# Patient Record
Sex: Male | Born: 1945 | Race: White | Hispanic: No | Marital: Single | Smoking: Never smoker
Health system: Southern US, Community
[De-identification: ages and names within clinical notes are randomized; demographics above are authoritative.]

## PROBLEM LIST (undated history)

## (undated) DIAGNOSIS — F0390 Unspecified dementia without behavioral disturbance: Secondary | ICD-10-CM

## (undated) DIAGNOSIS — I10 Essential (primary) hypertension: Secondary | ICD-10-CM

## (undated) DIAGNOSIS — E119 Type 2 diabetes mellitus without complications: Secondary | ICD-10-CM

---

## 2014-10-31 ENCOUNTER — Other Ambulatory Visit: Payer: Self-pay | Admitting: Physician Assistant

## 2016-07-04 ENCOUNTER — Emergency Department (HOSPITAL_COMMUNITY): Payer: Medicare Other

## 2016-07-04 ENCOUNTER — Emergency Department (HOSPITAL_COMMUNITY)
Admission: EM | Admit: 2016-07-04 | Discharge: 2016-07-04 | Disposition: A | Discharge status: Home / Self Care | Payer: Medicare Other | Attending: Emergency Medicine | Admitting: Emergency Medicine

## 2016-07-04 ENCOUNTER — Encounter (HOSPITAL_COMMUNITY): Payer: Self-pay | Admitting: *Deleted

## 2016-07-04 DIAGNOSIS — Z7982 Long term (current) use of aspirin: Secondary | ICD-10-CM | POA: Insufficient documentation

## 2016-07-04 DIAGNOSIS — I10 Essential (primary) hypertension: Secondary | ICD-10-CM | POA: Diagnosis not present

## 2016-07-04 DIAGNOSIS — E119 Type 2 diabetes mellitus without complications: Secondary | ICD-10-CM | POA: Diagnosis not present

## 2016-07-04 DIAGNOSIS — Z7984 Long term (current) use of oral hypoglycemic drugs: Secondary | ICD-10-CM | POA: Diagnosis not present

## 2016-07-04 DIAGNOSIS — R1013 Epigastric pain: Secondary | ICD-10-CM | POA: Insufficient documentation

## 2016-07-04 DIAGNOSIS — N400 Enlarged prostate without lower urinary tract symptoms: Secondary | ICD-10-CM | POA: Insufficient documentation

## 2016-07-04 DIAGNOSIS — Z79899 Other long term (current) drug therapy: Secondary | ICD-10-CM | POA: Diagnosis not present

## 2016-07-04 DIAGNOSIS — R0789 Other chest pain: Secondary | ICD-10-CM | POA: Diagnosis present

## 2016-07-04 HISTORY — DX: Type 2 diabetes mellitus without complications: E11.9

## 2016-07-04 HISTORY — DX: Essential (primary) hypertension: I10

## 2016-07-04 LAB — BASIC METABOLIC PANEL
ANION GAP: 14 (ref 5–15)
BUN: 14 mg/dL (ref 6–20)
CALCIUM: 9.7 mg/dL (ref 8.9–10.3)
CO2: 25 mmol/L (ref 22–32)
Chloride: 96 mmol/L — ABNORMAL LOW (ref 101–111)
Creatinine, Ser: 1.07 mg/dL (ref 0.61–1.24)
Glucose, Bld: 218 mg/dL — ABNORMAL HIGH (ref 65–99)
POTASSIUM: 3.2 mmol/L — AB (ref 3.5–5.1)
SODIUM: 135 mmol/L (ref 135–145)

## 2016-07-04 LAB — I-STAT TROPONIN, ED: Troponin i, poc: 0.01 ng/mL (ref 0.00–0.08)

## 2016-07-04 LAB — TROPONIN I: TROPONIN I: 0.04 ng/mL — AB (ref <0.03)

## 2016-07-04 LAB — CBC
HEMATOCRIT: 40.2 % (ref 39.0–52.0)
HEMOGLOBIN: 13.8 g/dL (ref 13.0–17.0)
MCH: 27.8 pg (ref 26.0–34.0)
MCHC: 34.3 g/dL (ref 30.0–36.0)
MCV: 81 fL (ref 78.0–100.0)
Platelets: 318 10*3/uL (ref 150–400)
RBC: 4.96 MIL/uL (ref 4.22–5.81)
RDW: 12.7 % (ref 11.5–15.5)
WBC: 14.9 10*3/uL — AB (ref 4.0–10.5)

## 2016-07-04 LAB — LIPASE, BLOOD: Lipase: 21 U/L (ref 11–51)

## 2016-07-04 MED ORDER — IOPAMIDOL (ISOVUE-300) INJECTION 61%
100.0000 mL | Freq: Once | INTRAVENOUS | Status: AC | PRN
  Administered 2016-07-04: 100 mL via INTRAVENOUS

## 2016-07-04 MED ORDER — MORPHINE SULFATE (PF) 4 MG/ML IV SOLN
6.0000 mg | Freq: Once | INTRAVENOUS | Status: AC
  Administered 2016-07-04: 6 mg via INTRAVENOUS
  Filled 2016-07-04: qty 2

## 2016-07-04 NOTE — ED Provider Notes (Signed)
[facility]EMERGENCY DEPT Provider Note   CSN: [id number] Arrival date & time: 07/04/16  1640     History   Chief Complaint Chief Complaint  Patient presents with  . Chest Pain    HPI Justin Foster is a 71 y.o. male with history of DM, HTN, who presents today with chief complaint acute onset, progressively improving epigastric pain which began at 2 AM this morning. He states pain was severe and awoke him, localized to the epigastric region with radiation straight to the back. He describes the pain as a "pressure ", which has progressively improved as the day has gone on. Laying down improved his symptoms, leaning forward worsened his pain. He has not had anything to eat or drink, and has not had any of his medications today. He states pain is currently very mild. He has a history of hiatal hernia, and states his symptoms feel similarly to his typical reflux, but were more severe. Had chili for dinner last night. He denies nausea, vomiting, diaphoresis, chest pain, shortness of breath, lightheadedness, syncope, melena, hematuria, dysuria. He states he has no significant cardiac history. He denies recent travel or surgeries, no hemoptysis, no history of DVT/PE, and no testosterone or hormone placement therapy.  The history is provided by the patient.    Past Medical History:  Diagnosis Date  . Diabetes mellitus without complication (HCC)   . Hypertension     There are no active problems to display for this patient.   History reviewed. No pertinent surgical history.     Home Medications    Prior to Admission medications   Medication Sig Start Date End Date Taking? Authorizing Provider  amLODipine (NORVASC) 10 MG tablet Take 10 mg by mouth at bedtime.  02/09/16  Yes [provider]  aspirin EC 81 MG tablet Take 81 mg by mouth daily. 07/08/15  Yes [provider]  glipiZIDE (GLUCOTROL) 5 MG tablet Take 5 mg by mouth 2 (two) times daily before a meal.  05/10/16  Yes  [provider]  ibuprofen (ADVIL,MOTRIN) 200 MG tablet Take 400 mg by mouth every 6 (six) hours as needed for headache or mild pain.   Yes [provider]  lisinopril-hydrochlorothiazide (PRINZIDE,ZESTORETIC) 20-25 MG tablet Take 1 tablet by mouth at bedtime.  05/10/16  Yes [provider]  metFORMIN (GLUCOPHAGE) 1000 MG tablet Take 500 mg by mouth.  02/09/16  Yes [provider]  simvastatin (ZOCOR) 20 MG tablet Take 5 mg by mouth at bedtime.  05/10/16  Yes [provider]  acetaminophen (TYLENOL) 500 MG tablet Take 1,000 mg by mouth every 6 (six) hours as needed for mild pain.    [provider]  ranitidine (ZANTAC) 150 MG tablet Take 150 mg by mouth as needed for heartburn.    [provider]    Family History No family history on file.  Social History Social History  Substance Use Topics  . Smoking status: Never Smoker  . Smokeless tobacco: Never Used  . Alcohol use No     Allergies   Patient has no known allergies.   Review of Systems Review of Systems  Constitutional: Positive for appetite change. Negative for chills, diaphoresis and fever.  Respiratory: Negative for shortness of breath.   Cardiovascular: Negative for chest pain and leg swelling.  Gastrointestinal: Positive for abdominal pain. Negative for diarrhea, nausea and vomiting.  Genitourinary: Negative for dysuria and hematuria.  Musculoskeletal: Negative for back pain.  Neurological: Negative for syncope and light-headedness.  All other systems reviewed and are negative.    Physical Exam Updated Vital Signs BP 127/74   Pulse 90   Temp 98.8 F (37.1 C) (Oral)   Resp 16   Ht 5\' 9"  (1.753 m)   Wt 74.8 kg (165 lb)   SpO2 94%   BMI 24.37 kg/m   Physical Exam  Constitutional: He appears well-developed and well-nourished.  HENT:  Head: Normocephalic and atraumatic.  Eyes: Conjunctivae are normal.  Neck: Neck supple.  Cardiovascular: Normal  rate and regular rhythm.   No murmur heard. Pulmonary/Chest: Effort normal and breath sounds normal. No respiratory distress. He exhibits no tenderness.  Abdominal: Soft. There is no tenderness.  Mild discomfort on palpation of the epigastric region. Murphy's sign absent, no tenderness to palpation at McBurney's point, Rovsing's absent. CVA tenderness absent  Musculoskeletal: He exhibits no edema.  Neurological: He is alert.  Skin: Skin is warm and dry.  Psychiatric: He has a normal mood and affect. His behavior is normal.  Nursing note and vitals reviewed.    ED Treatments / Results  Labs (all labs ordered are listed, but only abnormal results are displayed) Labs Reviewed  BASIC METABOLIC PANEL - Abnormal; Notable for the following:       Result Value   Potassium 3.2 (*)    Chloride 96 (*)    Glucose, Bld 218 (*)    All other components within normal limits  CBC - Abnormal; Notable for the following:    WBC 14.9 (*)    All other components within normal limits  TROPONIN I - Abnormal; Notable for the following:    Troponin I 0.04 (*)    All other components within normal limits  LIPASE, BLOOD  I-STAT TROPOININ, ED    EKG  EKG Interpretation  Date/Time:  Wednesday July 04 2016 17:49:24 EDT Ventricular Rate:  108 PR Interval:  158 QRS Duration: 100 QT Interval:  366 QTC Calculation: 490 R Axis:   68 Text Interpretation:  Sinus tachycardia Incomplete right bundle branch block Borderline ECG No significant change was found Confirmed by Azalia Bilis ([id number]) on 07/04/2016 9:19:19 PM       Radiology Dg Chest 2 View  Result Date: 07/04/2016 CLINICAL DATA:  Epigastric pain for 2 days, no relief with antacids, history GERD, hypertension, diabetes mellitus EXAM: CHEST  2 VIEW COMPARISON:  None FINDINGS: Normal heart size, mediastinal contours, and pulmonary vascularity. Minimal atherosclerotic calcification at aortic arch. Mild peribronchial thickening. No pulmonary  infiltrate, pleural effusion, or pneumothorax. Mild scattered endplate spur formation thoracic spine. IMPRESSION: Bronchitic changes without infiltrate. Aortic Atherosclerosis (ICD10-I70.0). Electronically Signed   By: Ulyses Southward M.D.   On: 07/04/2016 18:25   Ct Abdomen Pelvis W Contrast  Result Date: 07/04/2016 CLINICAL DATA:  Abdominal pain radiating to the back EXAM: CT ABDOMEN AND PELVIS WITH CONTRAST TECHNIQUE: Multidetector CT imaging of the abdomen and pelvis was performed using the standard protocol following bolus administration of intravenous contrast. CONTRAST:  ISOVUE-300 IOPAMIDOL (ISOVUE-300) INJECTION 61% COMPARISON:  None. FINDINGS: Lower chest: No pulmonary nodules. No visible pleural or pericardial effusion. There are coronary artery calcifications noted. Hepatobiliary: Normal hepatic size and contours without focal liver lesion. No perihepatic ascites. No intra- or extrahepatic biliary dilatation. Multifocal gallbladder calcification Pancreas: Normal pancreatic contours and enhancement. No peripancreatic fluid collection or pancreatic ductal dilatation. Spleen: Normal. Adrenals/Urinary Tract: Normal adrenal glands. No hydronephrosis or solid renal mass. 3.5 cm left renal cysts. Stomach/Bowel: There is no hiatal hernia. The stomach and  duodenum are normal. There is no dilated small bowel or enteric inflammation. There is no colonic abnormality. The appendix is normal. Vascular/Lymphatic: There is atherosclerotic calcification of the non aneurysmal abdominal aorta. No abdominal or pelvic adenopathy. Reproductive: The prostate is markedly enlarged measuring 6.6 cm transverse Musculoskeletal: Multilevel thoracolumbar osteophytosis. No bony spinal canal stenosis. Normal visualized extrathoracic and extraperitoneal soft tissues. Other: No contributory non-categorized findings. IMPRESSION: 1. No acute abnormality of the abdomen or pelvis. 2. Very large prostate, measuring 6.6 cm in transverse  dimension. 3. Multifocal gallbladder calcification, likely partial calcification of the gallbladder wall versus multiple adherent gallstones. No evidence of acute cholecystitis. 4.  Aortic Atherosclerosis (ICD10-I70.0). Electronically Signed   By: Deatra RobinsonKevin  Herman M.D.   On: 07/04/2016 22:31    Procedures Procedures (including critical care time)  Medications Ordered in ED Medications  morphine 4 MG/ML injection 6 mg (6 mg Intravenous Given 07/04/16 2222)  iopamidol (ISOVUE-300) 61 % injection 100 mL (100 mLs Intravenous Contrast Given 07/04/16 2200)     Initial Impression / Assessment and Plan / ED Course  I have reviewed the triage vital signs and the nursing notes.  Pertinent labs & imaging results that were available during my care of the patient were reviewed by me and considered in my medical decision making (see chart for details).     Patient with history of hiatal hernia presents today with progressively improving epigastric pain since 2 AM this morning. Afebrile, hypertensive here initially, however patient did not have any of his daily medications today.  EKG shows sinus tachycardia with no significant change from last, troponins negative. Low suspicion of ACS/MI given history and physical examination. Chest x-ray shows mild bronchitic changes without infiltrate. Low suspicion of pneumonia, PE, or pleural effusion. CBC shows nonspecific leukocytosis of 14.9. CT abdomen and pelvis shows no acute intra-abdominal abnormality. However does show a large prostate measuring 6.6 cm as well as multifocal gallbladder calcifications without evidence of acute cholecystitis. In the absence of an elevated lipase or evidence on imaging, low suspicion of acute pancreatitis. No evidence of AAA on imaging. Pain managed while in the ED. Suspect worsening reflux secondary to hiatal hernia. Recommend follow-up with primary care for reevaluation. Also recommend follow-up with urology for evaluation of enlarged  prostate. Encouraged continued use of patient's PPI, which has been helpful up to now. Discussed indications for return to the ED. Pt verbalized understanding of and agreement with plan and is safe for discharge home at this time. Final Clinical Impressions(s) / ED Diagnoses   Final diagnoses:  Epigastric pain  Enlarged prostate    New Prescriptions Discharge Medication List as of 07/04/2016 11:01 PM       Jeanie SewerFawze, Tywan Siever A, PA-C 07/04/16 2356    Azalia Bilisampos, Kevin, MD 07/05/16 [phone]

## 2016-07-04 NOTE — ED Triage Notes (Signed)
The pt is c/o chest or epigastric pain since 0200am today  He has also had some indigestion for the past several days. Hx of hiatus hernis  No sob no nausea

## 2016-07-04 NOTE — ED Notes (Signed)
Pt s iv placed and lab work drawn

## 2016-07-04 NOTE — Discharge Instructions (Signed)
Continue taking your reflux medications and blood pressure medications daily as prescribed. Follow-up with your primary care for reevaluation of your hiatal hernia. Follow-up with urology for evaluation of your enlarged prostate. Return to the ED if any concerning signs or symptoms develop.

## 2018-05-20 IMAGING — CT CT ABD-PELV W/ CM
2 of 5 series · 15 of 46 positions shown, 17 images · IV contrast (iopamidol)
Comparison: None.

CLINICAL DATA: Abdominal pain radiating to the back

EXAM:
CT ABDOMEN AND PELVIS WITH CONTRAST
TECHNIQUE: Multidetector CT imaging of the abdomen and pelvis was performed
using the standard protocol following bolus administration of
intravenous contrast.
CONTRAST:  100mL 3SXRGF-SDD IOPAMIDOL (3SXRGF-SDD) INJECTION 61%

[Series 3: abd/ pelvis 5.0 i30f 2 · axial · 0.67mm/px · z∈[+894,+1309]mm · 12 of 95 slices shown, 14 images]
[im 6/95  soft-tissue]
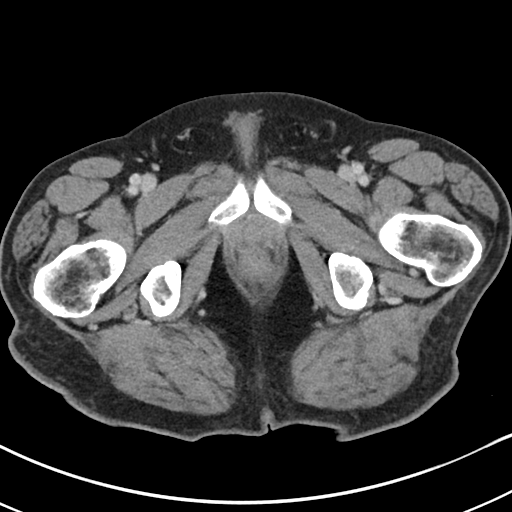
[im 6/95  bone]
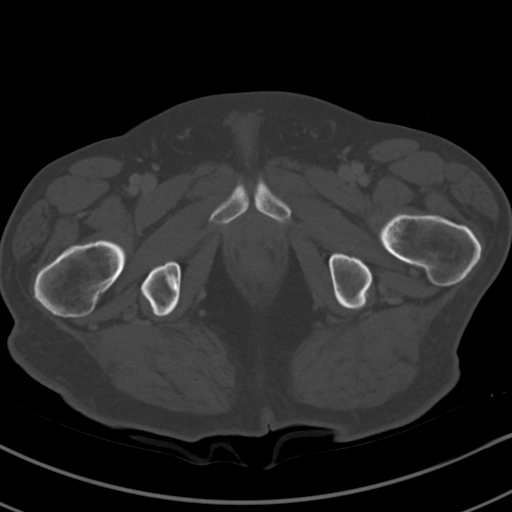
[im 17/95  soft-tissue]
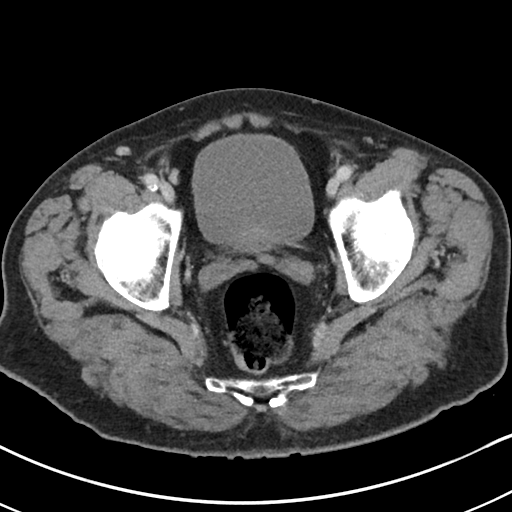
[im 23/95  soft-tissue]
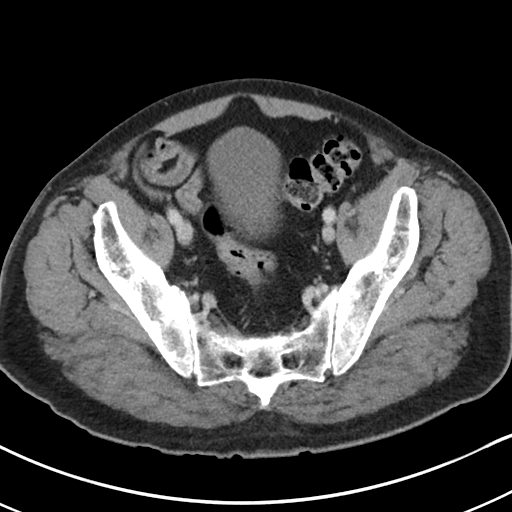
[im 28/95  soft-tissue]
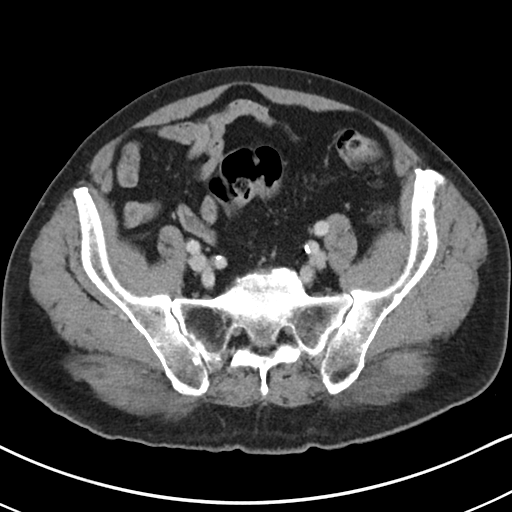
[im 39/95  soft-tissue]
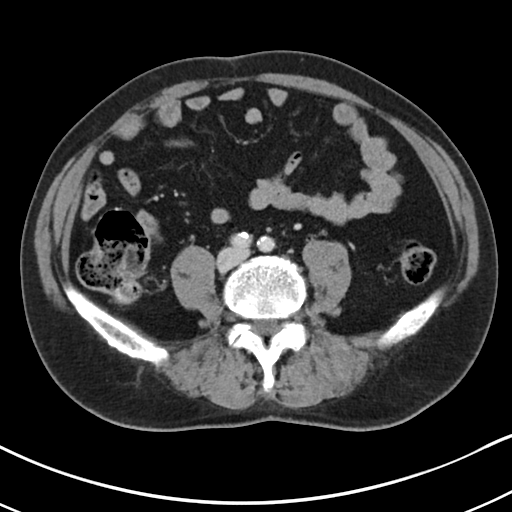
[im 45/95  soft-tissue]
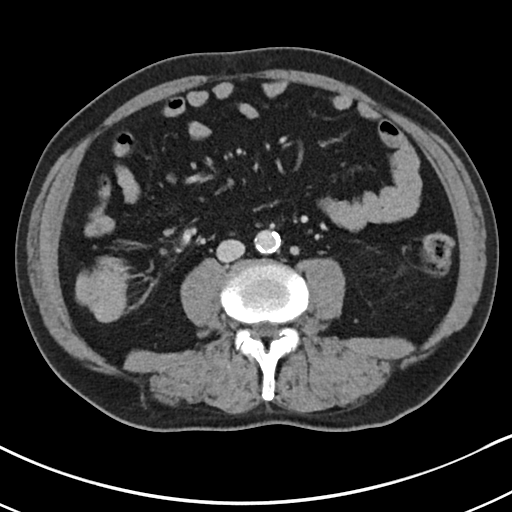
[im 50/95  soft-tissue]
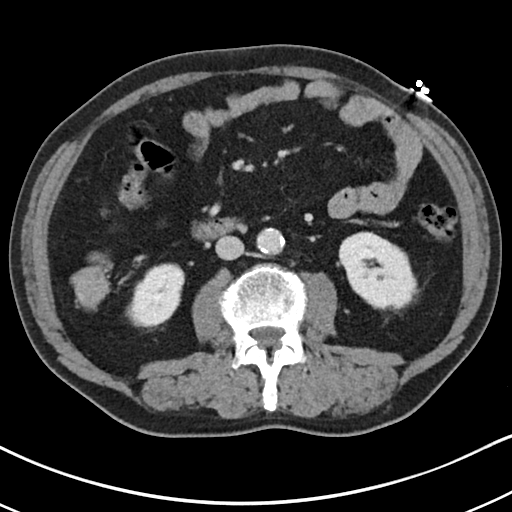
[im 61/95  soft-tissue]
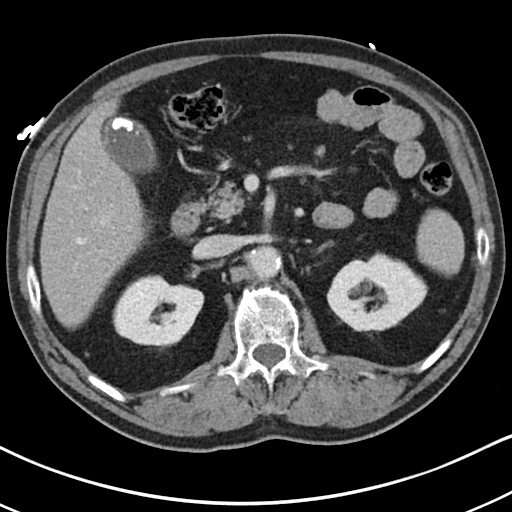
[im 67/95  soft-tissue]
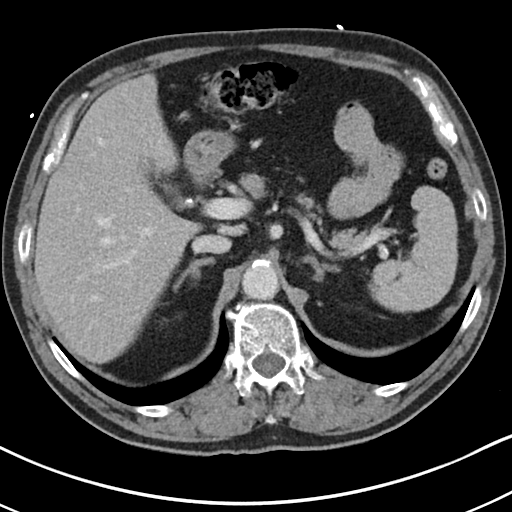
[im 67/95  bone]
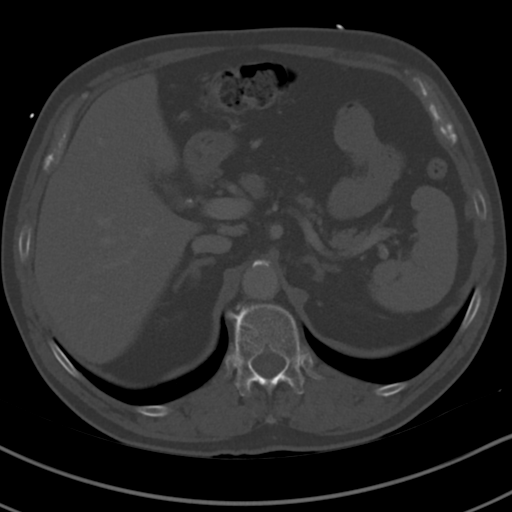
[im 72/95  soft-tissue]
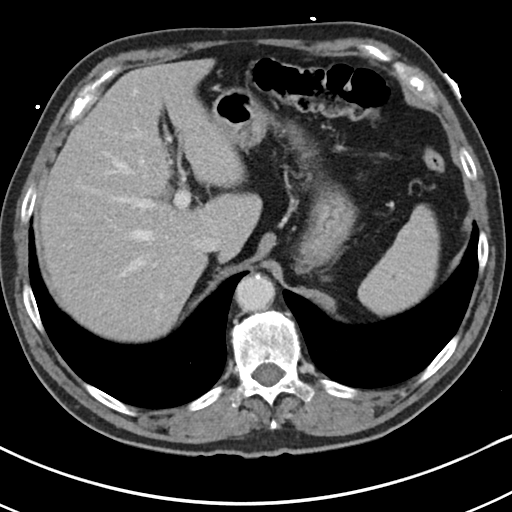
[im 83/95  soft-tissue]
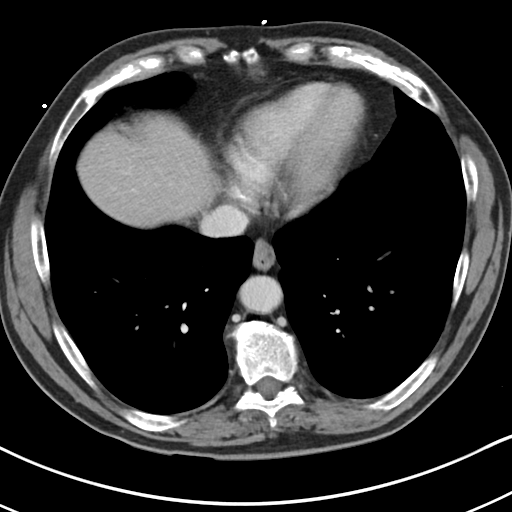
[im 89/95  soft-tissue]
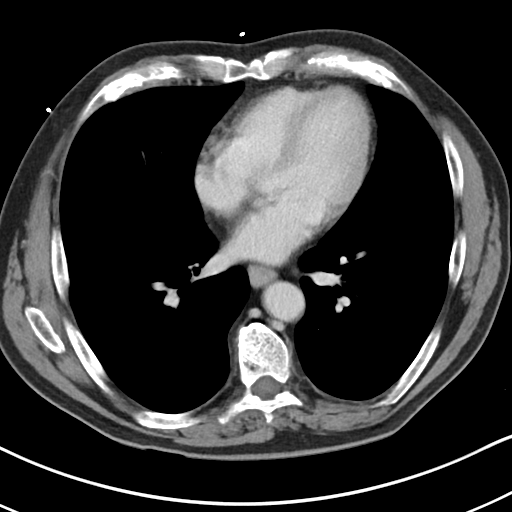

[Series 6: coronal soft tissue · coronal · 0.66mm/px · 3 of 101 slices shown]
[im 34/101  soft-tissue]
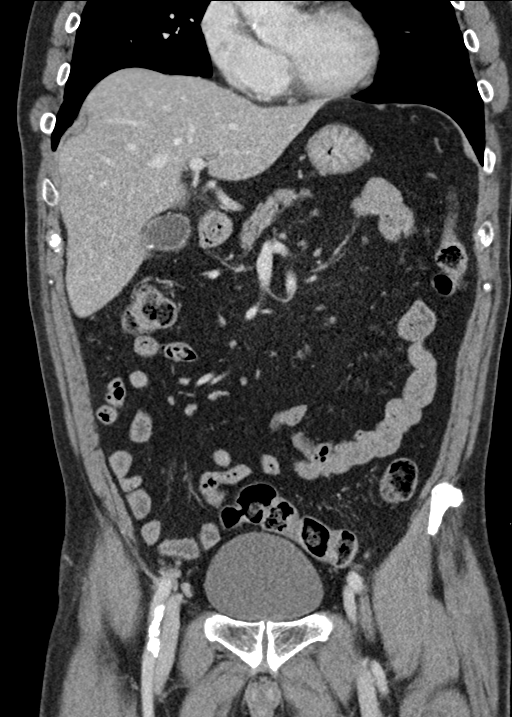
[im 45/101  soft-tissue]
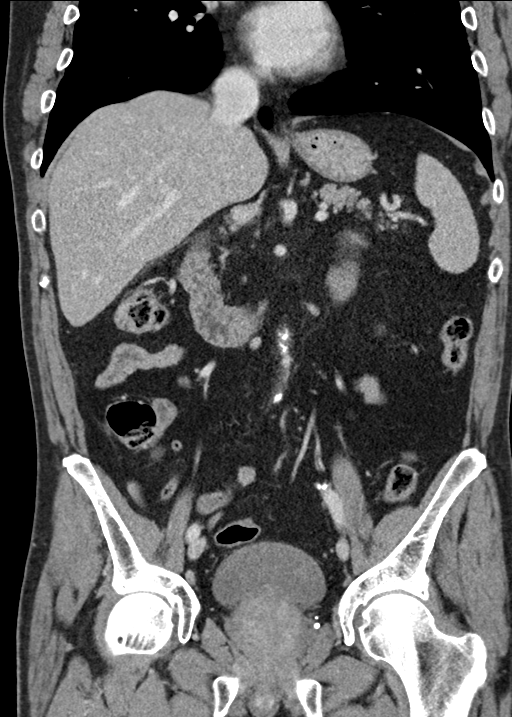
[im 56/101  soft-tissue]
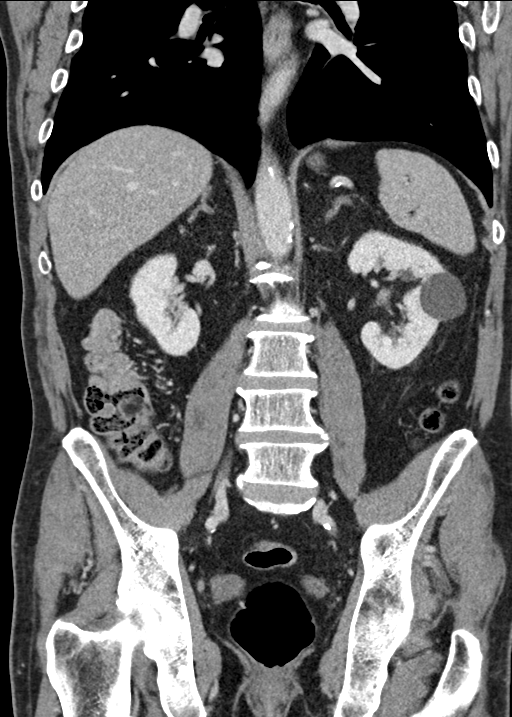

[15 of 46 positions shown; findings below may reference images not displayed]

FINDINGS: Lower chest: No pulmonary nodules. No visible pleural or pericardial
effusion. There are coronary artery calcifications noted.

Hepatobiliary: Normal hepatic size and contours without focal liver
lesion. No perihepatic ascites. No intra- or extrahepatic biliary
dilatation. Multifocal gallbladder calcification

Pancreas: Normal pancreatic contours and enhancement. No
peripancreatic fluid collection or pancreatic ductal dilatation.

Spleen: Normal.

Adrenals/Urinary Tract: Normal adrenal glands. No hydronephrosis or
solid renal mass. 3.5 cm left renal cysts.

Stomach/Bowel: There is no hiatal hernia. The stomach and duodenum
are normal. There is no dilated small bowel or enteric inflammation.
There is no colonic abnormality. The appendix is normal.

Vascular/Lymphatic: There is atherosclerotic calcification of the
non aneurysmal abdominal aorta. No abdominal or pelvic adenopathy.

Reproductive: The prostate is markedly enlarged measuring 6.6 cm
transverse

Musculoskeletal: Multilevel thoracolumbar osteophytosis. No bony
spinal canal stenosis. Normal visualized extrathoracic and
extraperitoneal soft tissues.

Other: No contributory non-categorized findings.
IMPRESSION: 1. No acute abnormality of the abdomen or pelvis.
2. Very large prostate, measuring 6.6 cm in transverse dimension.
3. Multifocal gallbladder calcification, likely partial
calcification of the gallbladder wall versus multiple adherent
gallstones. No evidence of acute cholecystitis.
4.  Aortic Atherosclerosis ([id number]-C26.6).

## 2022-09-18 ENCOUNTER — Other Ambulatory Visit: Payer: Self-pay

## 2022-09-18 ENCOUNTER — Emergency Department (HOSPITAL_BASED_OUTPATIENT_CLINIC_OR_DEPARTMENT_OTHER): Payer: Medicare HMO

## 2022-09-18 ENCOUNTER — Inpatient Hospital Stay (HOSPITAL_BASED_OUTPATIENT_CLINIC_OR_DEPARTMENT_OTHER)
Admission: EM | Admit: 2022-09-18 | Discharge: 2022-09-24 | DRG: 887 | Disposition: A | Discharge status: Home Health | Payer: Medicare HMO | Attending: Internal Medicine | Admitting: Internal Medicine

## 2022-09-18 ENCOUNTER — Encounter (HOSPITAL_BASED_OUTPATIENT_CLINIC_OR_DEPARTMENT_OTHER): Payer: Self-pay

## 2022-09-18 ENCOUNTER — Emergency Department (HOSPITAL_BASED_OUTPATIENT_CLINIC_OR_DEPARTMENT_OTHER): Payer: Medicare HMO | Admitting: Radiology

## 2022-09-18 DIAGNOSIS — E785 Hyperlipidemia, unspecified: Secondary | ICD-10-CM | POA: Diagnosis present

## 2022-09-18 DIAGNOSIS — G928 Other toxic encephalopathy: Secondary | ICD-10-CM | POA: Diagnosis not present

## 2022-09-18 DIAGNOSIS — E876 Hypokalemia: Secondary | ICD-10-CM | POA: Diagnosis not present

## 2022-09-18 DIAGNOSIS — Z7984 Long term (current) use of oral hypoglycemic drugs: Secondary | ICD-10-CM

## 2022-09-18 DIAGNOSIS — R112 Nausea with vomiting, unspecified: Secondary | ICD-10-CM

## 2022-09-18 DIAGNOSIS — Z7982 Long term (current) use of aspirin: Secondary | ICD-10-CM

## 2022-09-18 DIAGNOSIS — E119 Type 2 diabetes mellitus without complications: Secondary | ICD-10-CM | POA: Diagnosis present

## 2022-09-18 DIAGNOSIS — R338 Other retention of urine: Secondary | ICD-10-CM | POA: Diagnosis present

## 2022-09-18 DIAGNOSIS — K219 Gastro-esophageal reflux disease without esophagitis: Secondary | ICD-10-CM | POA: Diagnosis present

## 2022-09-18 DIAGNOSIS — I10 Essential (primary) hypertension: Secondary | ICD-10-CM | POA: Diagnosis present

## 2022-09-18 DIAGNOSIS — R410 Disorientation, unspecified: Secondary | ICD-10-CM

## 2022-09-18 DIAGNOSIS — G4700 Insomnia, unspecified: Principal | ICD-10-CM | POA: Diagnosis present

## 2022-09-18 DIAGNOSIS — Z1152 Encounter for screening for COVID-19: Secondary | ICD-10-CM | POA: Diagnosis not present

## 2022-09-18 DIAGNOSIS — Z79899 Other long term (current) drug therapy: Secondary | ICD-10-CM

## 2022-09-18 DIAGNOSIS — N401 Enlarged prostate with lower urinary tract symptoms: Secondary | ICD-10-CM | POA: Diagnosis present

## 2022-09-18 DIAGNOSIS — N179 Acute kidney failure, unspecified: Secondary | ICD-10-CM | POA: Diagnosis not present

## 2022-09-18 DIAGNOSIS — N320 Bladder-neck obstruction: Secondary | ICD-10-CM | POA: Diagnosis not present

## 2022-09-18 DIAGNOSIS — R509 Fever, unspecified: Secondary | ICD-10-CM

## 2022-09-18 DIAGNOSIS — R4182 Altered mental status, unspecified: Secondary | ICD-10-CM | POA: Diagnosis present

## 2022-09-18 DIAGNOSIS — G459 Transient cerebral ischemic attack, unspecified: Principal | ICD-10-CM

## 2022-09-18 LAB — RAPID URINE DRUG SCREEN, HOSP PERFORMED
Amphetamines: NOT DETECTED
Barbiturates: NOT DETECTED
Benzodiazepines: NOT DETECTED
Cocaine: NOT DETECTED
Opiates: NOT DETECTED
Tetrahydrocannabinol: NOT DETECTED

## 2022-09-18 LAB — RESP PANEL BY RT-PCR (RSV, FLU A&B, COVID)  RVPGX2
Influenza A by PCR: NEGATIVE
Influenza B by PCR: NEGATIVE
Resp Syncytial Virus by PCR: NEGATIVE
SARS Coronavirus 2 by RT PCR: NEGATIVE

## 2022-09-18 LAB — CBC
HCT: 38.8 % — ABNORMAL LOW (ref 39.0–52.0)
Hemoglobin: 13 g/dL (ref 13.0–17.0)
MCH: 28 pg (ref 26.0–34.0)
MCHC: 33.5 g/dL (ref 30.0–36.0)
MCV: 83.6 fL (ref 80.0–100.0)
Platelets: 262 10*3/uL (ref 150–400)
RBC: 4.64 MIL/uL (ref 4.22–5.81)
RDW: 12.1 % (ref 11.5–15.5)
WBC: 7.1 10*3/uL (ref 4.0–10.5)
nRBC: 0 % (ref 0.0–0.2)

## 2022-09-18 LAB — COMPREHENSIVE METABOLIC PANEL
ALT: 13 U/L (ref 0–44)
AST: 12 U/L — ABNORMAL LOW (ref 15–41)
Albumin: 4 g/dL (ref 3.5–5.0)
Alkaline Phosphatase: 52 U/L (ref 38–126)
Anion gap: 7 (ref 5–15)
BUN: 14 mg/dL (ref 8–23)
CO2: 29 mmol/L (ref 22–32)
Calcium: 9 mg/dL (ref 8.9–10.3)
Chloride: 99 mmol/L (ref 98–111)
Creatinine, Ser: 0.83 mg/dL (ref 0.61–1.24)
GFR, Estimated: >60 mL/min (ref >60)
Glucose, Bld: 211 mg/dL — ABNORMAL HIGH (ref 70–99)
Potassium: 3.3 mmol/L — ABNORMAL LOW (ref 3.5–5.1)
Sodium: 135 mmol/L (ref 135–145)
Total Bilirubin: 0.6 mg/dL (ref 0.3–1.2)
Total Protein: 7.2 g/dL (ref 6.5–8.1)

## 2022-09-18 LAB — URINALYSIS, ROUTINE W REFLEX MICROSCOPIC
Bacteria, UA: NONE SEEN
Bilirubin Urine: NEGATIVE
Glucose, UA: >1000 mg/dL — AB
Ketones, ur: NEGATIVE mg/dL
Leukocytes,Ua: NEGATIVE
Nitrite: NEGATIVE
Specific Gravity, Urine: 1.012 (ref 1.005–1.030)
pH: 7 (ref 5.0–8.0)

## 2022-09-18 LAB — ETHANOL: Alcohol, Ethyl (B): <10 mg/dL (ref <10)

## 2022-09-18 LAB — LIPASE, BLOOD: Lipase: <10 U/L — ABNORMAL LOW (ref 11–51)

## 2022-09-18 LAB — CBG MONITORING, ED: Glucose-Capillary: 184 mg/dL — ABNORMAL HIGH (ref 70–99)

## 2022-09-18 MED ORDER — ONDANSETRON HCL 4 MG/2ML IJ SOLN
4.0000 mg | Freq: Once | INTRAMUSCULAR | Status: AC
  Administered 2022-09-18: 4 mg via INTRAVENOUS
  Filled 2022-09-18: qty 2

## 2022-09-18 MED ORDER — LORAZEPAM 0.5 MG PO TABS
0.5000 mg | ORAL_TABLET | ORAL | Status: AC | PRN
  Administered 2022-09-19: 0.5 mg via ORAL
  Filled 2022-09-18: qty 1

## 2022-09-18 MED ORDER — LIDOCAINE 5 % EX PTCH
1.0000 | MEDICATED_PATCH | Freq: Once | CUTANEOUS | Status: AC
  Administered 2022-09-18: 1 via TRANSDERMAL
  Filled 2022-09-18: qty 1

## 2022-09-18 MED ORDER — ACETAMINOPHEN 500 MG PO TABS
1000.0000 mg | ORAL_TABLET | Freq: Once | ORAL | Status: AC
  Administered 2022-09-18: 1000 mg via ORAL
  Filled 2022-09-18: qty 2

## 2022-09-18 MED ORDER — ONDANSETRON 4 MG PO TBDP: 4.0000 mg | ORAL_TABLET | Freq: Once | ORAL | Status: DC

## 2022-09-18 MED ORDER — IOHEXOL 350 MG/ML SOLN
100.0000 mL | Freq: Once | INTRAVENOUS | Status: AC | PRN
  Administered 2022-09-18: 80 mL via INTRAVENOUS

## 2022-09-18 NOTE — ED Provider Notes (Signed)
[place] EMERGENCY DEPARTMENT AT [facility] Provider Note   CSN: [id number] Arrival date & time: 09/18/22  8657     History  Chief Complaint  Patient presents with   Altered Mental Status    Justin Foster is a 77 y.o. male.   Altered Mental Status    77 year old male with medical history significant for hypertension, diabetes mellitus who presents to the emergency department with his daughter with concern for strokelike symptoms.  The patient baseline mental status is alert and oriented x 2.  The daughter states that he has no known history of a diagnosis of dementia but generally is confused to year at baseline.  She states that this morning at around 7:00 when he woke up he had a transient episode of aphasia where he had difficulty formulating words.  Symptoms lasted for a total of 10 minutes.  His last normal was last night when he went to bed.  He has been having some epigastric discomfort as well, no vomiting.  Home Medications Prior to Admission medications   Medication Sig Start Date End Date Taking? Authorizing Provider  acetaminophen (TYLENOL) 500 MG tablet Take 1,000 mg by mouth every 6 (six) hours as needed for mild pain.    [provider]  amLODipine (NORVASC) 10 MG tablet Take 10 mg by mouth at bedtime.  02/09/16   [provider]  aspirin EC 81 MG tablet Take 81 mg by mouth daily. 07/08/15   [provider]  glipiZIDE (GLUCOTROL) 5 MG tablet Take 5 mg by mouth 2 (two) times daily before a meal.  05/10/16   [provider]  ibuprofen (ADVIL,MOTRIN) 200 MG tablet Take 400 mg by mouth every 6 (six) hours as needed for headache or mild pain.    [provider]  lisinopril-hydrochlorothiazide (PRINZIDE,ZESTORETIC) 20-25 MG tablet Take 1 tablet by mouth at bedtime.  05/10/16   [provider]  metFORMIN (GLUCOPHAGE) 1000 MG tablet Take 500 mg by mouth.  02/09/16   [provider]  ranitidine (ZANTAC) 150  MG tablet Take 150 mg by mouth as needed for heartburn.    [provider]  simvastatin (ZOCOR) 20 MG tablet Take 5 mg by mouth at bedtime.  05/10/16   [provider]      Allergies    Patient has no known allergies.    Review of Systems   Review of Systems  All other systems reviewed and are negative.   Physical Exam Updated Vital Signs BP (!) 165/91   Pulse 83   Temp 98.3 F (36.8 C) (Oral)   Resp 15   Ht 5\' 9"  (1.753 m)   Wt 64.4 kg   SpO2 99%   BMI 20.97 kg/m  Physical Exam Vitals and nursing note reviewed.  Constitutional:      General: He is not in acute distress. HENT:     Head: Normocephalic and atraumatic.  Eyes:     Conjunctiva/sclera: Conjunctivae normal.     Pupils: Pupils are equal, round, and reactive to light.  Cardiovascular:     Rate and Rhythm: Normal rate and regular rhythm.  Pulmonary:     Effort: Pulmonary effort is normal. No respiratory distress.  Abdominal:     General: There is no distension.     Tenderness: There is no abdominal tenderness. There is no guarding.  Musculoskeletal:        General: No deformity or signs of injury.     Cervical back: Neck supple.  Skin:    Findings: No lesion or rash.  Neurological:     General: No focal deficit present.     Mental Status: He is alert. Mental status is at baseline.     ED Results / Procedures / Treatments   Labs (all labs ordered are listed, but only abnormal results are displayed) Labs Reviewed  URINALYSIS, ROUTINE W REFLEX MICROSCOPIC - Abnormal; Notable for the following components:      Result Value   Glucose, UA >1,000 (*)    Hgb urine dipstick SMALL (*)    Protein, ur TRACE (*)    All other components within normal limits  CBC - Abnormal; Notable for the following components:   HCT 38.8 (*)    All other components within normal limits  COMPREHENSIVE METABOLIC PANEL - Abnormal; Notable for the following components:   Potassium 3.3 (*)    Glucose, Bld 211  (*)    AST 12 (*)    All other components within normal limits  CBG MONITORING, ED - Abnormal; Notable for the following components:   Glucose-Capillary 184 (*)    All other components within normal limits  RESP PANEL BY RT-PCR (RSV, FLU A&B, COVID)  RVPGX2  RAPID URINE DRUG SCREEN, HOSP PERFORMED  ETHANOL  LIPASE, BLOOD    EKG EKG Interpretation Date/Time:  Tuesday September 18 2022 09:25:37 EDT Ventricular Rate:  80 PR Interval:  182 QRS Duration:  101 QT Interval:  371 QTC Calculation: 428 R Axis:   -13  Text Interpretation: Sinus rhythm Inferior infarct, old Confirmed by Ernie Avena ([id number]) on 09/18/2022 9:34:00 AM  Radiology CT HEAD WO CONTRAST  Result Date: 09/18/2022 CLINICAL DATA:  Worsening confusion.  Altered mental status.  TIA. EXAM: CT HEAD WITHOUT CONTRAST TECHNIQUE: Contiguous axial images were obtained from the base of the skull through the vertex without intravenous contrast. RADIATION DOSE REDUCTION: This exam was performed according to the departmental dose-optimization program which includes automated exposure control, adjustment of the mA and/or kV according to patient size and/or use of iterative reconstruction technique. COMPARISON:  None Available. FINDINGS: Brain: No evidence of intracranial hemorrhage, acute infarction, hydrocephalus, extra-axial collection, or mass lesion/mass effect. Vascular:  No hyperdense vessel or other acute findings. Skull: No evidence of fracture or other significant bone abnormality. Sinuses/Orbits:  No acute findings. Other: None. IMPRESSION: Negative noncontrast head CT. Electronically Signed   By: Danae Orleans M.D.   On: 09/18/2022 13:00   DG Chest 2 View  Result Date: 09/18/2022 CLINICAL DATA:  Altered mental status EXAM: CHEST - 2 VIEW COMPARISON:  X-ray 07/04/2016 FINDINGS: No consolidation, pneumothorax or effusion. No edema. Normal cardiopericardial silhouette. Overlapping cardiac leads. Degenerative changes are seen along the  spine. IMPRESSION: No acute cardiopulmonary disease. Electronically Signed   By: Karen Kays M.D.   On: 09/18/2022 12:35    Procedures Procedures    Medications Ordered in ED Medications  LORazepam (ATIVAN) tablet 0.5 mg (has no administration in time range)  ondansetron (ZOFRAN) injection 4 mg (4 mg Intravenous Given 09/18/22 1608)    ED Course/ Medical Decision Making/ A&P                                 Medical Decision Making Amount and/or Complexity of Data Reviewed Labs: ordered. Radiology: ordered.  Risk Prescription drug management. Decision regarding hospitalization.    77 year old male with medical history significant for hypertension, diabetes mellitus who presents to the  emergency department with his daughter with concern for strokelike symptoms.  The patient baseline mental status is alert and oriented x 2.  The daughter states that he has no known history of a diagnosis of dementia but generally is confused to year at baseline.  She states that this morning at around 7:00 when he woke up he had a transient episode of aphasia where he had difficulty formulating words.  Symptoms lasted for a total of 10 minutes.  His last normal was last night when he went to bed.  He has been having some epigastric discomfort as well, no vomiting.  On arrival, the patient was afebrile, not tachycardic or tachypneic, RR 19 on my evaluation, BP 183/92, saturating 100% on room air.  Physical exam significant for normal neurologic exam. The patient is since returned to his baseline after an episode of transient aphasia earlier today that lasted around 10 minutes.  Concern for TIA.  Additionally the patient has been having some nausea and epigastric discomfort and vomiting.  Laboratory evaluation revealed UDS negative, urinalysis without evidence of UTI, CBC without a leukocytosis or anemia, CBG 184, ethanol level negative, CMP with only mild hypokalemia to 3.3, otherwise generally unremarkable  with mild hyperglycemia to 211 without an anion gap acidosis.  COVID-19 influenza PCR testing was collected and pending, lipase was collected and pending.  CT imaging of head revealed no acute intracranial abnormality and a chest x-ray was without abnormal findings.  I discussed the care of the patient with on-call neurology, Dr. Otelia Limes who recommended inpatient workup for TIA.  Further workup initiated with CT of the abdomen pelvis as the patient denied epigastric pain but was having ongoing vomiting and stated on repeat assessment that he was not passing gas.  CT abdomen pelvi was ordered to further evaluate to rule out bowel obstruction.  Plan at time of signout to follow-up CT imaging, admit the patient for TIA workup.  Signout given to Dr. Theresia Lo at 1600.   Final Clinical Impression(s) / ED Diagnoses Final diagnoses:  TIA (transient ischemic attack)  Nausea and vomiting, unspecified vomiting type    Rx / DC Orders ED Discharge Orders     None         Ernie Avena, MD 09/18/22 1625

## 2022-09-18 NOTE — ED Notes (Signed)
Report called to Percell Miller, RN, awaiting [facility] for transportation, patient and family aware.

## 2022-09-18 NOTE — ED Provider Notes (Signed)
Patient was signed out to me by Dr. Karene Fry at 1530 pending CT.  In short this is a 77 year old male with a past medical history of hypertension and diabetes that presented to the emergency department after an episode of aphasia lasting for about 10 minutes that resolved.  Workup here showed no acute neurologic deficits and initial head CT was negative.  Previous team and spoke with neurology who recommended admission for TIA.  Prior to admission patient started to complain of abdominal pain with nausea and vomited x 1.  Reports had not been passing gas.  Patient was signed out to me pending CT and lipase prior to admission. Clinical Course as of 09/18/22 1926  Tue Sep 18, 2022  1851 No evidence of bowel obstruction. UA negative for UTI. Will call back the hospitalist for admission. [VK]    Clinical Course User Index [VK] Rexford Maus, DO      Rexford Maus, [place] 09/18/22 [phone]

## 2022-09-18 NOTE — ED Triage Notes (Signed)
States been having increasing confusion over last several months getting worse.  Woke up this am very confused and wandering.  States also having epigastric pain.

## 2022-09-19 ENCOUNTER — Observation Stay (HOSPITAL_COMMUNITY): Payer: Medicare HMO

## 2022-09-19 DIAGNOSIS — R338 Other retention of urine: Secondary | ICD-10-CM

## 2022-09-19 DIAGNOSIS — R569 Unspecified convulsions: Secondary | ICD-10-CM

## 2022-09-19 DIAGNOSIS — I10 Essential (primary) hypertension: Secondary | ICD-10-CM

## 2022-09-19 DIAGNOSIS — R112 Nausea with vomiting, unspecified: Secondary | ICD-10-CM

## 2022-09-19 DIAGNOSIS — R509 Fever, unspecified: Secondary | ICD-10-CM

## 2022-09-19 DIAGNOSIS — E119 Type 2 diabetes mellitus without complications: Secondary | ICD-10-CM

## 2022-09-19 DIAGNOSIS — G459 Transient cerebral ischemic attack, unspecified: Secondary | ICD-10-CM

## 2022-09-19 DIAGNOSIS — E876 Hypokalemia: Secondary | ICD-10-CM

## 2022-09-19 DIAGNOSIS — R410 Disorientation, unspecified: Secondary | ICD-10-CM | POA: Diagnosis not present

## 2022-09-19 LAB — GLUCOSE, CAPILLARY
Glucose-Capillary: 227 mg/dL — ABNORMAL HIGH (ref 70–99)
Glucose-Capillary: 196 mg/dL — ABNORMAL HIGH (ref 70–99)
Glucose-Capillary: 211 mg/dL — ABNORMAL HIGH (ref 70–99)

## 2022-09-19 LAB — BASIC METABOLIC PANEL
Anion gap: 9 (ref 5–15)
BUN: 24 mg/dL — ABNORMAL HIGH (ref 8–23)
CO2: 25 mmol/L (ref 22–32)
Calcium: 8.8 mg/dL — ABNORMAL LOW (ref 8.9–10.3)
Chloride: 102 mmol/L (ref 98–111)
Creatinine, Ser: 1.59 mg/dL — ABNORMAL HIGH (ref 0.61–1.24)
GFR, Estimated: 45 mL/min — ABNORMAL LOW (ref >60)
Glucose, Bld: 212 mg/dL — ABNORMAL HIGH (ref 70–99)
Potassium: 3.3 mmol/L — ABNORMAL LOW (ref 3.5–5.1)
Sodium: 136 mmol/L (ref 135–145)

## 2022-09-19 LAB — MAGNESIUM: Magnesium: 1.8 mg/dL (ref 1.7–2.4)

## 2022-09-19 LAB — AMMONIA: Ammonia: 28 umol/L (ref 9–35)

## 2022-09-19 LAB — VITAMIN B12: Vitamin B-12: 258 pg/mL (ref 180–914)

## 2022-09-19 LAB — HEMOGLOBIN A1C
Hgb A1c MFr Bld: 7 % — ABNORMAL HIGH (ref 4.8–5.6)
Mean Plasma Glucose: 154.2 mg/dL

## 2022-09-19 LAB — RPR: RPR Ser Ql: NONREACTIVE

## 2022-09-19 LAB — TSH: TSH: 2.482 u[IU]/mL (ref 0.350–4.500)

## 2022-09-19 MED ORDER — INSULIN ASPART 100 UNIT/ML IJ SOLN
0.0000 [IU] | Freq: Three times a day (TID) | INTRAMUSCULAR | Status: DC
  Administered 2022-09-19: 3 [IU] via SUBCUTANEOUS
  Administered 2022-09-19: 2 [IU] via SUBCUTANEOUS
  Administered 2022-09-20 (×2): 3 [IU] via SUBCUTANEOUS
  Administered 2022-09-20: 2 [IU] via SUBCUTANEOUS
  Administered 2022-09-21: 3 [IU] via SUBCUTANEOUS
  Administered 2022-09-21 – 2022-09-22 (×3): 2 [IU] via SUBCUTANEOUS
  Administered 2022-09-22 – 2022-09-23 (×3): 3 [IU] via SUBCUTANEOUS
  Administered 2022-09-23: 2 [IU] via SUBCUTANEOUS
  Administered 2022-09-23: 3 [IU] via SUBCUTANEOUS
  Administered 2022-09-24: 2 [IU] via SUBCUTANEOUS
  Administered 2022-09-24: 3 [IU] via SUBCUTANEOUS

## 2022-09-19 MED ORDER — ACETAMINOPHEN 650 MG RE SUPP: 650.0000 mg | Freq: Four times a day (QID) | RECTAL | Status: DC | PRN

## 2022-09-19 MED ORDER — SIMVASTATIN 5 MG PO TABS
5.0000 mg | ORAL_TABLET | Freq: Every day | ORAL | Status: DC
  Administered 2022-09-19 – 2022-09-23 (×5): 5 mg via ORAL
  Filled 2022-09-19 (×6): qty 1

## 2022-09-19 MED ORDER — ASPIRIN 81 MG PO TBEC: 81.0000 mg | DELAYED_RELEASE_TABLET | Freq: Every day | ORAL | Status: DC

## 2022-09-19 MED ORDER — POTASSIUM CHLORIDE CRYS ER 20 MEQ PO TBCR
40.0000 meq | EXTENDED_RELEASE_TABLET | Freq: Once | ORAL | Status: AC
  Administered 2022-09-19: 40 meq via ORAL
  Filled 2022-09-19: qty 2

## 2022-09-19 MED ORDER — PANTOPRAZOLE SODIUM 40 MG PO TBEC
40.0000 mg | DELAYED_RELEASE_TABLET | Freq: Every day | ORAL | Status: DC
  Administered 2022-09-19 – 2022-09-24 (×6): 40 mg via ORAL
  Filled 2022-09-19 (×7): qty 1

## 2022-09-19 MED ORDER — IOHEXOL 350 MG/ML SOLN
75.0000 mL | Freq: Once | INTRAVENOUS | Status: AC | PRN
  Administered 2022-09-19: 75 mL via INTRAVENOUS

## 2022-09-19 MED ORDER — AMLODIPINE BESYLATE 10 MG PO TABS
10.0000 mg | ORAL_TABLET | Freq: Every day | ORAL | Status: DC
  Administered 2022-09-19 – 2022-09-20 (×2): 10 mg via ORAL
  Filled 2022-09-19 (×2): qty 1

## 2022-09-19 MED ORDER — HYDROXYZINE HCL 10 MG PO TABS
10.0000 mg | ORAL_TABLET | Freq: Three times a day (TID) | ORAL | Status: DC | PRN
  Administered 2022-09-19 – 2022-09-24 (×5): 10 mg via ORAL
  Filled 2022-09-19 (×5): qty 1

## 2022-09-19 MED ORDER — HYDRALAZINE HCL 20 MG/ML IJ SOLN: 10.0000 mg | INTRAMUSCULAR | Status: DC | PRN

## 2022-09-19 MED ORDER — ACETAMINOPHEN 325 MG PO TABS
650.0000 mg | ORAL_TABLET | Freq: Four times a day (QID) | ORAL | Status: DC | PRN
  Administered 2022-09-20 – 2022-09-23 (×4): 650 mg via ORAL
  Filled 2022-09-19 (×5): qty 2

## 2022-09-19 MED ORDER — ENOXAPARIN SODIUM 40 MG/0.4ML IJ SOSY: 40.0000 mg | PREFILLED_SYRINGE | INTRAMUSCULAR | Status: DC

## 2022-09-19 MED ORDER — INSULIN ASPART 100 UNIT/ML IJ SOLN
0.0000 [IU] | Freq: Every day | INTRAMUSCULAR | Status: DC
  Administered 2022-09-19 – 2022-09-23 (×3): 2 [IU] via SUBCUTANEOUS

## 2022-09-19 NOTE — Progress Notes (Signed)
Pt admitted to room, oriented to unit and surroundings. Daughter at bedside. Denies pain. Alert and oriented. Pt placement notified of pt's arrival for notification to admitting provider.

## 2022-09-19 NOTE — Progress Notes (Signed)
   09/19/22 0620  Vitals  ECG Heart Rate 89  MEWS COLOR  MEWS Score Color Green  MEWS Score  MEWS Temp 0  MEWS Systolic 0  MEWS Pulse 0  MEWS RR 0  MEWS LOC 0  MEWS Score 0  Provider Notification  Provider Name/Title Dr. Loney Loh  Date Provider Notified 09/19/22  Time Provider Notified [phone]  Method of Notification  (secure chat)  Notification Reason Other (Comment) (in and out cath x2 this shift (pink urine) hematuria this am. Pt unable to void after multiple attempts this am)  Provider response No new orders  Date of Provider Response 09/19/22  Time of Provider Response [phone]

## 2022-09-19 NOTE — Consult Note (Signed)
Neurology Consultation Reason for Consult: Transient speech difficulty Referring Physician: Hilbert Corrigan  CC: Transient speech difficulty  History is obtained from: Patient  HPI: Justin Foster is a 77 y.o. male who was already awake yesterday morning when his daughter found him to be having some trouble getting his thoughts out.  He stated that "my mind just would let me" and is try to get his words out.  Apparently this is something that has happened before, and the patient states that this happens to him off and on.   Overnight, he had similar episodes, and was seeing livestock outside the window which was not there.  In the emergency department, he had a temperature of 100.1, but denies any symptoms of URI, etc.  He did have urinary retention and has had to have two in and out catheterizations.   LKW: 9/2 prior to bed tnk given?: no, resolution of symptoms  Past Medical History:  Diagnosis Date   Diabetes mellitus without complication (HCC)    Hypertension      History reviewed. No pertinent family history.   Social History:  reports that he has never smoked. He has never used smokeless tobacco. He reports that he does not drink alcohol. No history on file for drug use.   Exam: Current vital signs: BP 136/84 (BP Location: Left Arm)   Pulse 92   Temp 98.6 F (37 C) (Oral)   Resp 17   Ht 5\' 9"  (1.753 m)   Wt 64.4 kg   SpO2 96%   BMI 20.97 kg/m  Vital signs in last 24 hours: Temp:  [98.3 F (36.8 C)-100.1 F (37.8 C)] 98.6 F (37 C) (09/04 0325) Pulse Rate:  [75-102] 92 (09/04 0325) Resp:  [12-34] 17 (09/04 0325) BP: (136-206)/(79-103) 136/84 (09/04 0325) SpO2:  [96 %-100 %] 96 % (09/04 0325) Weight:  [64.4 kg] 64.4 kg (09/03 0923)   Physical Exam  Appears well-developed and well-nourished.   Neuro: Mental Status: Patient is awake, alert, oriented to person, he is able to give the year after some deliberation, but unable to give the month. He has an increased  latency of speech, but no definite aphasia. Cranial Nerves: II: Visual Fields are full. Pupils are equal, round, and reactive to light.   III,IV, VI: EOMI without ptosis or diploplia.  V: Facial sensation is symmetric to temperature VII: Facial movement is symmetric.  VIII: hearing is intact to voice X: Uvula elevates symmetrically XI: Shoulder shrug is symmetric. XII: tongue is midline without atrophy or fasciculations.  Motor: Tone is normal. Bulk is normal. 5/5 strength was present in all four extremities.  Sensory: Sensation is symmetric to light touch and temperature in the arms and legs. Cerebellar: No clear ataxia     I have reviewed labs in epic and the results pertinent to this consultation are: Results for orders placed or performed during the hospital encounter of 09/18/22 (from the past 24 hour(s))  CBC     Status: Abnormal   Collection Time: 09/18/22 10:24 AM  Result Value Ref Range   WBC 7.1 4.0 - 10.5 K/uL   RBC 4.64 4.22 - 5.81 MIL/uL   Hemoglobin 13.0 13.0 - 17.0 g/dL   HCT 16.1 (L) 09.6 - 04.5 %   MCV 83.6 80.0 - 100.0 fL   MCH 28.0 26.0 - 34.0 pg   MCHC 33.5 30.0 - 36.0 g/dL   RDW 40.9 81.1 - 91.4 %   Platelets 262 150 - 400 K/uL   nRBC  0.0 0.0 - 0.2 %  Comprehensive metabolic panel     Status: Abnormal   Collection Time: 09/18/22 10:24 AM  Result Value Ref Range   Sodium 135 135 - 145 mmol/L   Potassium 3.3 (L) 3.5 - 5.1 mmol/L   Chloride 99 98 - 111 mmol/L   CO2 29 22 - 32 mmol/L   Glucose, Bld 211 (H) 70 - 99 mg/dL   BUN 14 8 - 23 mg/dL   Creatinine, Ser 2.95 0.61 - 1.24 mg/dL   Calcium 9.0 8.9 - 62.1 mg/dL   Total Protein 7.2 6.5 - 8.1 g/dL   Albumin 4.0 3.5 - 5.0 g/dL   AST 12 (L) 15 - 41 U/L   ALT 13 0 - 44 U/L   Alkaline Phosphatase 52 38 - 126 U/L   Total Bilirubin 0.6 0.3 - 1.2 mg/dL   GFR, Estimated >30 >86 mL/min   Anion gap 7 5 - 15  Ethanol     Status: None   Collection Time: 09/18/22 10:24 AM  Result Value Ref Range   Alcohol,  Ethyl (B) <10 <10 mg/dL  POC CBG, ED     Status: Abnormal   Collection Time: 09/18/22 10:36 AM  Result Value Ref Range   Glucose-Capillary 184 (H) 70 - 99 mg/dL  Urine rapid drug screen (hosp performed)not at [facility]     Status: None   Collection Time: 09/18/22 10:37 AM  Result Value Ref Range   Opiates NONE DETECTED NONE DETECTED   Cocaine NONE DETECTED NONE DETECTED   Benzodiazepines NONE DETECTED NONE DETECTED   Amphetamines NONE DETECTED NONE DETECTED   Tetrahydrocannabinol NONE DETECTED NONE DETECTED   Barbiturates NONE DETECTED NONE DETECTED  Urinalysis, Routine w reflex microscopic -Urine, Clean Catch     Status: Abnormal   Collection Time: 09/18/22 10:37 AM  Result Value Ref Range   Color, Urine YELLOW YELLOW   APPearance CLEAR CLEAR   Specific Gravity, Urine 1.012 1.005 - 1.030   pH 7.0 5.0 - 8.0   Glucose, UA >1,000 (A) NEGATIVE mg/dL   Hgb urine dipstick SMALL (A) NEGATIVE   Bilirubin Urine NEGATIVE NEGATIVE   Ketones, ur NEGATIVE NEGATIVE mg/dL   Protein, ur TRACE (A) NEGATIVE mg/dL   Nitrite NEGATIVE NEGATIVE   Leukocytes,Ua NEGATIVE NEGATIVE   RBC / HPF 0-5 0 - 5 RBC/hpf   WBC, UA 0-5 0 - 5 WBC/hpf   Bacteria, UA NONE SEEN NONE SEEN   Squamous Epithelial / HPF 0-5 0 - 5 /HPF   Mucus PRESENT   Lipase, blood     Status: Abnormal   Collection Time: 09/18/22  3:55 PM  Result Value Ref Range   Lipase <10 (L) 11 - 51 U/L  Resp panel by RT-PCR (RSV, Flu A&B, Covid) Anterior Nasal Swab     Status: None   Collection Time: 09/18/22  4:14 PM   Specimen: Anterior Nasal Swab  Result Value Ref Range   SARS Coronavirus 2 by RT PCR NEGATIVE NEGATIVE   Influenza A by PCR NEGATIVE NEGATIVE   Influenza B by PCR NEGATIVE NEGATIVE   Resp Syncytial Virus by PCR NEGATIVE NEGATIVE     I have reviewed the images obtained: MRI brain-negative  Impression: 77 year old male with waxing and waning speech difficulty in the setting of low-grade fever.  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  The fact that he endorses that he has had similar problems the speech ongoing intermittently for quite some time would argue against cerebrovascular etiology.  I do think a CTA head and neck to assess and make sure he does not have a severe left carotid stenosis would be reasonable, but if this is negative that I would not favor ascribing the symptoms to cerebrovascular disease.  I suspect that he has a mild delirium induced by what ever caused his low-grade fever, and recommend a workup as detailed below.  Recommendations: 1) B12, TSH, RPR, ammonia 2) EEG 3) CTA head and neck 4) continue home aspirin 5) infectious screening per internal medicine 6) neurology continue to follow   Ritta Slot, MD [facility] [phone]  If 7pm- 7am, please page neurology on call as listed in AMION.

## 2022-09-19 NOTE — TOC Initial Note (Addendum)
Transition of Care [facility]) - Initial/Assessment Note    Patient Details  Name: Justin Foster MRN: [id number] Date of Birth: July 22, 1945  [facility] CM/SW Contact:    Kermit Balo, RN Phone Number: 09/19/2022, 11:39 AM  Clinical Narrative:    PCP: Dr Mayford Knife in [place]               Pt is from home alone but family checks on him daily. Son lives next door.  No DME.  Pts son provides needed transportation. Pt has been managing his own medications. Per daughter he quit taking them about 2 weeks ago. CM counseled on the importance of taking medications as prescribed. Daughter states they are going to get a pill box and fill it up and check daily if he is taking his medications.  Family will transport home when medically ready.  Expected Discharge Plan: Home/Self Care Barriers to Discharge: Continued Medical Work up   Patient Goals and CMS Choice   CMS Medicare.gov Compare Post Acute Care list provided to:: Patient Represenative (must comment) Choice offered to / list presented to : Adult Children      Expected Discharge Plan and Services   Discharge Planning Services: CM Consult   Living arrangements for the past 2 months: Single Family Home                                      Prior Living Arrangements/Services Living arrangements for the past 2 months: Single Family Home Lives with:: Self Patient language and need for interpreter reviewed:: Yes Do you feel safe going back to the place where you live?: Yes      Need for Family Participation in Patient Care: Yes (Comment)     Criminal Activity/Legal Involvement Pertinent to Current Situation/Hospitalization: No - Comment as needed  Activities of Daily Living Home Assistive Devices/Equipment: Eyeglasses ADL Screening (condition at time of admission) Patient's cognitive ability adequate to safely complete daily activities?: Yes Is the patient deaf or have difficulty hearing?: No Does the  patient have difficulty seeing, even when wearing glasses/contacts?: No Does the patient have difficulty concentrating, remembering, or making decisions?: Yes Patient able to express need for assistance with ADLs?: Yes Does the patient have difficulty dressing or bathing?: No Independently performs ADLs?: Yes (appropriate for developmental age) Does the patient have difficulty walking or climbing stairs?: No Weakness of Legs: None Weakness of Arms/Hands: None  Permission Sought/Granted                  Emotional Assessment Appearance:: Appears stated age Attitude/Demeanor/Rapport:  (confused) Affect (typically observed): Pleasant Orientation: : Oriented to Self, Oriented to Place   Psych Involvement: No (comment)  Admission diagnosis:  TIA (transient ischemic attack) [G45.9] Nausea and vomiting, unspecified vomiting type [R11.2] Patient Active Problem List   Diagnosis Date Noted   Confusion 09/19/2022   Fever 09/19/2022   Acute urinary retention 09/19/2022   Hypokalemia 09/19/2022   Type 2 diabetes mellitus (HCC) 09/19/2022   Essential hypertension 09/19/2022   PCP:  Patient, No Pcp Per Pharmacy:   [facility] [address], [place] - [address] [place] [address] Phone: [phone] Fax: [phone]     Social Determinants of Health (SDOH) Social History: SDOH Screenings   Food Insecurity: No Food Insecurity (09/18/2022)  Housing: Low Risk  (09/19/2022)  Transportation Needs: No Transportation Needs (09/19/2022)  Utilities: Not At Risk (09/19/2022)  Social Connections: Unknown (01/08/2022)   Received from [facility]  Tobacco Use: Low Risk  (09/18/2022)   SDOH Interventions:     Readmission Risk Interventions     No data to display

## 2022-09-19 NOTE — Progress Notes (Signed)
Pt off of unit for MRI.

## 2022-09-19 NOTE — Progress Notes (Signed)
   09/19/22 0216  Vitals  ECG Heart Rate 95  MEWS COLOR  MEWS Score Color Green  MEWS Score  MEWS Temp 0  MEWS Systolic 0  MEWS Pulse 0  MEWS RR 0  MEWS LOC 0  MEWS Score 0  Provider Notification  Provider Name/Title Dr. Loney Loh  Date Provider Notified 09/19/22  Time Provider Notified 0217  Method of Notification Page  Notification Reason Other (Comment) (pt is requesting something to eat, can we get a diet order? [facility] here for stroke workup. Also urinary frequency noted, pt has voided in small amounts x3 since admission (2330).  Bladder scan 712 ml at this time.)  Provider response See new orders  Date of Provider Response 09/19/22  Time of Provider Response 0230

## 2022-09-19 NOTE — Progress Notes (Signed)
Progress Note   Patient: Justin Foster WUJ:[id number] DOB: 02-05-1945 DOA: 09/18/2022     0 DOS: the patient was seen and examined on 09/19/2022   Brief hospital course: 77 y.o. male with medical history significant of type 2 diabetes, hypertension, hyperlipidemia, GERD presented to ED for evaluation of transient episode of aphasia yesterday morning at around 7 AM, symptoms resolved in about 10 minutes.  Per family, LKW the night before.  Patient endorsed epigastric abdominal pain, nausea, and vomiting.  He had no acute neurologic deficits on arrival to the ED.  Temperature 100.1 F.  Blood pressure elevated in the ED with systolic above 200.  Labs showing no leukocytosis, potassium 3.3, glucose 211, no elevation of lipase or LFTs, blood ethanol level <10, UDS negative, UA not suggestive of infection, COVID/influenza/RSV PCR negative.  CT head and brain MRI negative for acute intracranial abnormality.  Chest x-ray not suggestive of pneumonia.  CT abdomen pelvis negative for acute findings.  CT did show moderate bladder distention and moderate prostatomegaly which may contribute to the bladder distention. Patient received Tylenol, Zofran, and Ativan in the ED. Neurology consulted.   Assessment and Plan: Transient aphasia Confusion/mild delirium Blood ethanol level <10, UDS negative.   -CT head and brain MRI negative for acute intracranial abnormality.  -CTA head and neck reviewed, neg, MRI brain neg -  B12, TSH, RPR, and ammonia levels unremarkable   Fever No leukocytosis on labs.  COVID/influenza/RSV PCR negative.   -Chest x-ray not suggestive of pneumonia.   -UA not suggestive of infection.   -f/u blood cx -Of note, pt is retaining significant amount of urine, requiring I/O cath   Nausea, Patient endorsed epigastric abdominal discomfort in the ED but denies at this time.   -CT abdomen pelvis unremarkable   Acute urinary retention with ARF -CT showing moderate bladder distention and moderate  prostatomegaly which may contribute to the bladder distention.  -Cont to check bladder scan and I/o cath if >300cc   Mild hypokalemia Replace potassium as needed Recheck basic metabolic panel in the morning   Type 2 diabetes Cont ssi as needed -Hold oral hypoglycemic regiment while in the hospital -A1c 7.0   Hypertension Blood pressure currently suboptimally controlled. Continue home Norvasc 10 mg p.o. daily Hold lisinopril and chlorothiazide given acute renal failure Continue as needed hydralazine IV   Hyperlipidemia -Will continue home simvastatin  GERD Will continue patient with Protonix   Subjective: Pleasantly confused this morning, difficult to assess  Physical Exam: Vitals:   09/19/22 0325 09/19/22 0823 09/19/22 1205 09/19/22 1646  BP: 136/84 127/75 (!) 140/71 (!) 174/84  Pulse: 92 79 94 94  Resp: 17 18 18    Temp: 98.6 F (37 C) 98.9 F (37.2 C) 98.6 F (37 C) 98.4 F (36.9 C)  TempSrc: Oral Oral Oral Oral  SpO2: 96% 98%  100%  Weight:      Height:       General exam: Awake, laying in bed, in nad Respiratory system: Normal respiratory effort, no wheezing Cardiovascular system: regular rate, s1, s2 Gastrointestinal system: Soft, nondistended, positive BS Central nervous system: CN2-12 grossly intact, strength intact Extremities: Perfused, no clubbing Skin: Normal skin turgor, no notable skin lesions seen Psychiatry: Difficult to assess given mentation  Data Reviewed:  Labs reviewed, sodium 136, potassium 3.3, BUN 24, creatinine 1.59, WBC 7.1, hemoglobin 13.0  Family Communication: Patient room, patient's daughter at bedside  Disposition: Status is: Observation The patient remains OBS appropriate and will d/c before 2 midnights.  Planned Discharge Destination: Home     Author: Rickey Barbara, MD 09/19/2022 5:03 PM  For on call review [email or web address].

## 2022-09-19 NOTE — Progress Notes (Signed)
EEG complete - results pending 

## 2022-09-19 NOTE — Hospital Course (Signed)
77 y.o. male with medical history significant of type 2 diabetes, hypertension, hyperlipidemia, GERD presented to ED for evaluation of transient episode of aphasia yesterday morning at around 7 AM, symptoms resolved in about 10 minutes.  Per family, LKW the night before.  Patient endorsed epigastric abdominal pain, nausea, and vomiting.  He had no acute neurologic deficits on arrival to the ED.  Temperature 100.1 F.  Blood pressure elevated in the ED with systolic above 200.  Labs showing no leukocytosis, potassium 3.3, glucose 211, no elevation of lipase or LFTs, blood ethanol level <10, UDS negative, UA not suggestive of infection, COVID/influenza/RSV PCR negative.  CT head and brain MRI negative for acute intracranial abnormality.  Chest x-ray not suggestive of pneumonia.  CT abdomen pelvis negative for acute findings.  CT did show moderate bladder distention and moderate prostatomegaly which may contribute to the bladder distention. Patient received Tylenol, Zofran, and Ativan in the ED. Neurology consulted.

## 2022-09-19 NOTE — Procedures (Signed)
Patient Name: JAMARQUIS ROMMEL  MRN: [id number]  Epilepsy Attending: Charlsie Quest  Referring Physician/Provider: Rejeana Brock, MD  Date: 09/19/2022 Duration: 25.58 mins  Patient history: 77 year old male with waxing and waning speech difficulty in the setting of low-grade fever.  EEG to evaluate for seizure  Level of alertness: Awake  AEDs during EEG study: Ativan  Technical aspects: This EEG study was done with scalp electrodes positioned according to the 10-20 International system of electrode placement. Electrical activity was reviewed with band pass filter of 1-70Hz , sensitivity of 7 uV/mm, display speed of 61mm/sec with a 60Hz  notched filter applied as appropriate. EEG data were recorded continuously and digitally stored.  Video monitoring was available and reviewed as appropriate.  Description: The posterior dominant rhythm consists of 8 Hz activity of moderate voltage (25-35 uV) seen predominantly in posterior head regions, symmetric and reactive to eye opening and eye closing. Physiologic photic driving was not seen during photic stimulation.  Hyperventilation was not performed.     IMPRESSION: This study is within normal limits. No seizures or epileptiform discharges were seen throughout the recording.  A normal interictal EEG does not exclude the diagnosis of epilepsy.  Darvell Monteforte Annabelle Harman

## 2022-09-19 NOTE — H&P (Signed)
History and Physical    Justin Foster:[id number] DOB: [birth year] DOA: 09/18/2022  PCP: Patient, No Pcp Per  Patient coming from: [facility] ED  Chief Complaint: Aphasia  HPI: Justin Foster is a 77 y.o. male with medical history significant of type 2 diabetes, hypertension, hyperlipidemia, GERD presented to ED for evaluation of transient episode of aphasia yesterday morning at around 7 AM, symptoms resolved in about 10 minutes.  Per family, LKW the night before.  Patient endorsed epigastric abdominal pain, nausea, and vomiting.  He had no acute neurologic deficits on arrival to the ED.  Temperature 100.1 F.  Blood pressure elevated in the ED with systolic above 200.  Labs showing no leukocytosis, potassium 3.3, glucose 211, no elevation of lipase or LFTs, blood ethanol level <10, UDS negative, UA not suggestive of infection, COVID/influenza/RSV PCR negative.  CT head and brain MRI negative for acute intracranial abnormality.  Chest x-ray not suggestive of pneumonia.  CT abdomen pelvis negative for acute findings.  CT did show moderate bladder distention and moderate prostatomegaly which may contribute to the bladder distention. Patient received Tylenol, Zofran, and Ativan in the ED. Neurology consulted.  Patient appears quite confused. Oriented to self only. He is not able to recognize his daughter present at bedside. Daughter states patient is normally oriented to person and place but not necessarily time and slightly forgetful at baseline. Yesterday morning around 7 AM family noticed that he was having difficulty with speech/word finding difficulty which lasted about 10 minutes. No facial droop or weakness of arm or leg at that time. Daughter is concerned that since yesterday patient has been very confused and was seeing things in the emergency room which did not exist.  Per daughter, patient has not complained of any abdominal pain but did report nausea as he has not eaten anything all day due to being  in the ED. He does have history of GERD.  No vomiting reported.  Review of Systems:  Review of Systems  All other systems reviewed and are negative.   Past Medical History:  Diagnosis Date   Diabetes mellitus without complication (HCC)    Hypertension     History reviewed. No pertinent surgical history.   reports that he has never smoked. He has never used smokeless tobacco. He reports that he does not drink alcohol. No history on file for drug use.  No Known Allergies  History reviewed. No pertinent family history.  Prior to Admission medications   Medication Sig Start Date End Date Taking? Authorizing Provider  empagliflozin (JARDIANCE) 25 MG TABS tablet Take 25 mg by mouth daily.   Yes [provider]  metFORMIN (GLUCOPHAGE) 1000 MG tablet Take 500 mg by mouth.  02/09/16  Yes [provider]  acetaminophen (TYLENOL) 500 MG tablet Take 1,000 mg by mouth every 6 (six) hours as needed for mild pain.    [provider]  amLODipine (NORVASC) 10 MG tablet Take 10 mg by mouth at bedtime.  02/09/16   [provider]  aspirin EC 81 MG tablet Take 81 mg by mouth daily. 07/08/15   [provider]  glipiZIDE (GLUCOTROL) 5 MG tablet Take 10 mg by mouth daily. Take 2 tabs tabs by mouth daily 05/10/16   [provider]  ibuprofen (ADVIL,MOTRIN) 200 MG tablet Take 400 mg by mouth every 6 (six) hours as needed for headache or mild pain.    [provider]  lisinopril-hydrochlorothiazide (PRINZIDE,ZESTORETIC) 20-25 MG tablet Take 1 tablet by mouth  at bedtime.  05/10/16   [provider]  ranitidine (ZANTAC) 150 MG tablet Take 150 mg by mouth as needed for heartburn.    [provider]  simvastatin (ZOCOR) 20 MG tablet Take 5 mg by mouth at bedtime.  05/10/16   [provider]    Physical Exam: Vitals:   09/18/22 2145 09/18/22 2230 09/18/22 2330 09/19/22 0325  BP: (!) 175/86 (!) 187/92 (!) 183/92 136/84  Pulse:  91 90 (!) 102 92  Resp: (!) 26 16 18 17   Temp:   100.1 F (37.8 C) 98.6 F (37 C)  TempSrc:   Oral Oral  SpO2: 98% 98% 97% 96%  Weight:      Height:        Physical Exam Vitals reviewed.  Constitutional:      General: He is not in acute distress. HENT:     Head: Normocephalic and atraumatic.  Eyes:     Extraocular Movements: Extraocular movements intact.  Cardiovascular:     Rate and Rhythm: Normal rate and regular rhythm.     Pulses: Normal pulses.  Pulmonary:     Effort: Pulmonary effort is normal. No respiratory distress.     Breath sounds: Normal breath sounds. No wheezing or rales.  Abdominal:     General: Bowel sounds are normal. There is no distension.     Palpations: Abdomen is soft.     Tenderness: There is no abdominal tenderness. There is no guarding or rebound.  Musculoskeletal:     Cervical back: Normal range of motion.     Right lower leg: No edema.     Left lower leg: No edema.  Skin:    General: Skin is warm and dry.  Neurological:     General: No focal deficit present.     Mental Status: He is alert.     Cranial Nerves: No cranial nerve deficit.     Sensory: No sensory deficit.     Motor: No weakness.     Comments: Confused, oriented to self only Following commands appropriately     Labs on Admission: I have personally reviewed following labs and imaging studies  CBC: Recent Labs  Lab 09/18/22 1024  WBC 7.1  HGB 13.0  HCT 38.8*  MCV 83.6  PLT 262   Basic Metabolic Panel: Recent Labs  Lab 09/18/22 1024  NA 135  K 3.3*  CL 99  CO2 29  GLUCOSE 211*  BUN 14  CREATININE 0.83  CALCIUM 9.0   GFR: Estimated Creatinine Clearance: 69 mL/min (by C-G formula based on SCr of 0.83 mg/dL). Liver Function Tests: Recent Labs  Lab 09/18/22 1024  AST 12*  ALT 13  ALKPHOS 52  BILITOT 0.6  PROT 7.2  ALBUMIN 4.0   Recent Labs  Lab 09/18/22 1555  LIPASE <10*   No results for input(s): "AMMONIA" in the last 168 hours. Coagulation  Profile: No results for input(s): "INR", "PROTIME" in the last 168 hours. Cardiac Enzymes: No results for input(s): "CKTOTAL", "CKMB", "CKMBINDEX", "TROPONINI" in the last 168 hours. BNP (last 3 results) No results for input(s): "PROBNP" in the last 8760 hours. HbA1C: No results for input(s): "HGBA1C" in the last 72 hours. CBG: Recent Labs  Lab 09/18/22 1036  GLUCAP 184*   Lipid Profile: No results for input(s): "CHOL", "HDL", "LDLCALC", "TRIG", "CHOLHDL", "LDLDIRECT" in the last 72 hours. Thyroid Function Tests: No results for input(s): "TSH", "T4TOTAL", "FREET4", "T3FREE", "THYROIDAB" in the last 72 hours. Anemia Panel: No results for input(s): "  VITAMINB12", "FOLATE", "FERRITIN", "TIBC", "IRON", "RETICCTPCT" in the last 72 hours. Urine analysis:    Component Value Date/Time   COLORURINE YELLOW 09/18/2022 1037   APPEARANCEUR CLEAR 09/18/2022 1037   LABSPEC 1.012 09/18/2022 1037   PHURINE 7.0 09/18/2022 1037   GLUCOSEU >1,000 (A) 09/18/2022 1037   HGBUR SMALL (A) 09/18/2022 1037   BILIRUBINUR NEGATIVE 09/18/2022 1037   KETONESUR NEGATIVE 09/18/2022 1037   PROTEINUR TRACE (A) 09/18/2022 1037   NITRITE NEGATIVE 09/18/2022 1037   LEUKOCYTESUR NEGATIVE 09/18/2022 1037    Radiological Exams on Admission: MR BRAIN WO CONTRAST  Result Date: 09/19/2022 CLINICAL DATA:  Initial evaluation for TIA. EXAM: MRI HEAD WITHOUT CONTRAST TECHNIQUE: Multiplanar, multiecho pulse sequences of the brain and surrounding structures were obtained without intravenous contrast. COMPARISON:  CT from 09/18/2022. FINDINGS: Brain: Cerebral volume within normal limits for age. Scattered patchy T2/FLAIR hyperintensity involving the supratentorial cerebral white matter, most likely related chronic microvascular ischemic disease, mild in nature. No evidence for acute or subacute ischemia. Gray-white matter differentiation maintained. No areas of chronic cortical infarction. No acute or chronic intracranial blood  products. No mass lesion, midline shift or mass effect. No hydrocephalus or extra-axial fluid collection. Pituitary gland suprasellar region within normal limits. Vascular: Major intracranial vascular flow voids are maintained. Skull and upper cervical spine: Cranial junction within normal limits. Bone marrow signal intensity normal. No scalp soft tissue abnormality. Sinuses/Orbits: Globes and orbital soft tissues within normal limits. Few small retention cyst noted within the paranasal sinuses. Paranasal sinuses are otherwise clear. No mastoid effusion. Other: None. IMPRESSION: 1. No acute intracranial abnormality. 2. Mild chronic microvascular ischemic disease for age. Electronically Signed   By: Rise Mu M.D.   On: 09/19/2022 03:46   CT ABDOMEN PELVIS W CONTRAST  Result Date: 09/18/2022 CLINICAL DATA:  Bowel obstruction suspected. Increasing confusion over the last several months with epigastric pain. EXAM: CT ABDOMEN AND PELVIS WITH CONTRAST TECHNIQUE: Multidetector CT imaging of the abdomen and pelvis was performed using the standard protocol following bolus administration of intravenous contrast. RADIATION DOSE REDUCTION: This exam was performed according to the departmental dose-optimization program which includes automated exposure control, adjustment of the mA and/or kV according to patient size and/or use of iterative reconstruction technique. CONTRAST:  80mL OMNIPAQUE IOHEXOL 350 MG/ML SOLN COMPARISON:  Abdominopelvic CT 07/04/2016. FINDINGS: Lower chest: Clear lung bases. No significant pleural or pericardial effusion. Hepatobiliary: The liver is normal in density without suspicious focal abnormality. There are several small calcified gallstones. No evidence of gallbladder wall thickening, surrounding inflammation or biliary ductal dilatation. Pancreas: Unremarkable. No pancreatic ductal dilatation or surrounding inflammatory changes. Spleen: Normal in size without focal abnormality.  Adrenals/Urinary Tract: Both adrenal glands appear normal. No evidence of urinary tract calculus, suspicious renal lesion or hydronephrosis. There are similar benign-appearing renal cysts bilaterally for which no specific follow-up imaging is recommended. The urinary bladder is moderately distended with possible mild perivesical soft tissue stranding. No bladder wall thickening or other focal abnormality identified. Stomach/Bowel: No enteric contrast administered. The stomach appears unremarkable for its degree of distension. No evidence of bowel wall thickening, distention or surrounding inflammatory change. The appendix appears normal. Vascular/Lymphatic: There are no enlarged abdominal or pelvic lymph nodes. Aortic and branch vessel atherosclerosis without evidence of aneurysm or large vessel occlusion. Reproductive: The prostate gland is moderately enlarged. Other: No evidence of abdominal wall mass or hernia. No ascites or pneumoperitoneum. Musculoskeletal: No acute or significant osseous findings. Mild spondylosis. Unless specific follow-up recommendations are mentioned in the  findings or impression sections, no imaging follow-up of any mentioned incidental findings is recommended. IMPRESSION: 1. No evidence of bowel obstruction or other acute abdominopelvic findings. 2. Cholelithiasis without evidence of cholecystitis or biliary ductal dilatation. 3. Moderate bladder distension with possible mild perivesical soft tissue stranding. Correlate with urinalysis to exclude cystitis. 4. Moderate prostatomegaly, as before, which may contribute to the bladder distension on the basis of outlet obstruction. Correlate clinically. 5.  Aortic Atherosclerosis (ICD10-I70.0). Electronically Signed   By: Carey Bullocks M.D.   On: 09/18/2022 18:37   CT HEAD WO CONTRAST  Result Date: 09/18/2022 CLINICAL DATA:  Worsening confusion.  Altered mental status.  TIA. EXAM: CT HEAD WITHOUT CONTRAST TECHNIQUE: Contiguous axial images  were obtained from the base of the skull through the vertex without intravenous contrast. RADIATION DOSE REDUCTION: This exam was performed according to the departmental dose-optimization program which includes automated exposure control, adjustment of the mA and/or kV according to patient size and/or use of iterative reconstruction technique. COMPARISON:  None Available. FINDINGS: Brain: No evidence of intracranial hemorrhage, acute infarction, hydrocephalus, extra-axial collection, or mass lesion/mass effect. Vascular:  No hyperdense vessel or other acute findings. Skull: No evidence of fracture or other significant bone abnormality. Sinuses/Orbits:  No acute findings. Other: None. IMPRESSION: Negative noncontrast head CT. Electronically Signed   By: Danae Orleans M.D.   On: 09/18/2022 13:00   DG Chest 2 View  Result Date: 09/18/2022 CLINICAL DATA:  Altered mental status EXAM: CHEST - 2 VIEW COMPARISON:  X-ray 07/04/2016 FINDINGS: No consolidation, pneumothorax or effusion. No edema. Normal cardiopericardial silhouette. Overlapping cardiac leads. Degenerative changes are seen along the spine. IMPRESSION: No acute cardiopulmonary disease. Electronically Signed   By: Karen Kays M.D.   On: 09/18/2022 12:35    EKG: Independently reviewed. Sinus rhythm, no STEMI.   Assessment and Plan  Transient aphasia Confusion/mild delirium Blood ethanol level <10, UDS negative.  CT head and brain MRI negative for acute intracranial abnormality. Neurology feels that his symptoms are related to underlying neurocognitive disorder rather than stroke.  Recommended getting CTA head and neck to rule out severe carotid stenosis.  Recommended EEG and checking B12, TSH, RPR, and ammonia levels.  Neurology recommending continuing home aspirin, will hold off ordering at this time as RN is concerned that the patient's urine appears slightly bloody.  Continue infectious workup given fever.  Fever No leukocytosis on labs.   COVID/influenza/RSV PCR negative.  Chest x-ray not suggestive of pneumonia.  UA not suggestive of infection.  Seen by neurology and his presentation is not felt to be concerning for meningitis.  Blood cultures ordered.  Continue Tylenol as needed for fevers.  Nausea, ?epigastric pain Patient endorsed epigastric abdominal discomfort in the ED but denies at this time.  He is confused.  Per daughter, he has not complained of any abdominal pain but did report nausea earlier from not being able to eat in the ED.  No vomiting reported.  No elevation of lipase or LFTs.  Abdominal exam benign.  CT abdomen pelvis without any acute findings to explain these symptoms.  Acute urinary retention CT showing moderate bladder distention and moderate prostatomegaly which may contribute to the bladder distention. Patient has not been able to void and has already undergone In-N-Out cath twice tonight with >700 ml urine drained each time.  Foley ordered.  Per RN, his urine appeared pink in color.  Continue to monitor closely.  Mild hypokalemia Monitor potassium and magnesium levels, continue to replace as needed.  Type 2 diabetes Check A1c, sensitive sliding scale insulin ACHS.  Hypertension Blood pressure was elevated in the ED but currently normotensive.  Continue home meds after pharmacy med rec is done.  Hyperlipidemia GERD Pharmacy med rec pending.  DVT prophylaxis: SCDs Code Status: Full Code (discussed with the patient's daughter) Family Communication: Daughter Samantha at bedside. Level of care: Telemetry bed Admission status: It is my clinical opinion that referral for OBSERVATION is reasonable and necessary in this patient based on the above information provided. The aforementioned taken together are felt to place the patient at high risk for further clinical deterioration. However, it is anticipated that the patient may be medically stable for discharge from the hospital within 24 to 48  hours.  John Giovanni MD [facility]  If 7PM-7AM, please contact night-coverage [email or web address]  09/19/2022, 7:53 AM

## 2022-09-19 NOTE — Plan of Care (Signed)
°  Problem: Education: °Goal: Knowledge of General Education information will improve °Description: Including pain rating scale, medication(s)/side effects and non-pharmacologic comfort measures °Outcome: Not Progressing °  °Problem: Health Behavior/Discharge Planning: °Goal: Ability to manage health-related needs will improve °Outcome: Not Progressing °  °Problem: Clinical Measurements: °Goal: Ability to maintain clinical measurements within normal limits will improve °Outcome: Progressing °  °

## 2022-09-20 DIAGNOSIS — Z7984 Long term (current) use of oral hypoglycemic drugs: Secondary | ICD-10-CM | POA: Diagnosis not present

## 2022-09-20 DIAGNOSIS — G4700 Insomnia, unspecified: Secondary | ICD-10-CM | POA: Diagnosis present

## 2022-09-20 DIAGNOSIS — G928 Other toxic encephalopathy: Secondary | ICD-10-CM | POA: Diagnosis present

## 2022-09-20 DIAGNOSIS — R4182 Altered mental status, unspecified: Secondary | ICD-10-CM | POA: Diagnosis present

## 2022-09-20 DIAGNOSIS — I10 Essential (primary) hypertension: Secondary | ICD-10-CM | POA: Diagnosis present

## 2022-09-20 DIAGNOSIS — K219 Gastro-esophageal reflux disease without esophagitis: Secondary | ICD-10-CM | POA: Diagnosis present

## 2022-09-20 DIAGNOSIS — E876 Hypokalemia: Secondary | ICD-10-CM | POA: Diagnosis present

## 2022-09-20 DIAGNOSIS — Z7982 Long term (current) use of aspirin: Secondary | ICD-10-CM | POA: Diagnosis not present

## 2022-09-20 DIAGNOSIS — R112 Nausea with vomiting, unspecified: Secondary | ICD-10-CM | POA: Diagnosis not present

## 2022-09-20 DIAGNOSIS — N320 Bladder-neck obstruction: Secondary | ICD-10-CM | POA: Diagnosis present

## 2022-09-20 DIAGNOSIS — R410 Disorientation, unspecified: Secondary | ICD-10-CM | POA: Diagnosis not present

## 2022-09-20 DIAGNOSIS — N179 Acute kidney failure, unspecified: Secondary | ICD-10-CM | POA: Diagnosis present

## 2022-09-20 DIAGNOSIS — R338 Other retention of urine: Secondary | ICD-10-CM | POA: Diagnosis present

## 2022-09-20 DIAGNOSIS — G459 Transient cerebral ischemic attack, unspecified: Secondary | ICD-10-CM | POA: Diagnosis not present

## 2022-09-20 DIAGNOSIS — E785 Hyperlipidemia, unspecified: Secondary | ICD-10-CM | POA: Diagnosis present

## 2022-09-20 DIAGNOSIS — N401 Enlarged prostate with lower urinary tract symptoms: Secondary | ICD-10-CM | POA: Diagnosis present

## 2022-09-20 DIAGNOSIS — Z1152 Encounter for screening for COVID-19: Secondary | ICD-10-CM | POA: Diagnosis not present

## 2022-09-20 DIAGNOSIS — Z79899 Other long term (current) drug therapy: Secondary | ICD-10-CM | POA: Diagnosis not present

## 2022-09-20 DIAGNOSIS — R569 Unspecified convulsions: Secondary | ICD-10-CM | POA: Diagnosis not present

## 2022-09-20 DIAGNOSIS — E119 Type 2 diabetes mellitus without complications: Secondary | ICD-10-CM | POA: Diagnosis present

## 2022-09-20 LAB — MENINGITIS/ENCEPHALITIS PANEL (CSF)

## 2022-09-20 LAB — GLUCOSE, CAPILLARY
Glucose-Capillary: 168 mg/dL — ABNORMAL HIGH (ref 70–99)
Glucose-Capillary: 217 mg/dL — ABNORMAL HIGH (ref 70–99)
Glucose-Capillary: 208 mg/dL — ABNORMAL HIGH (ref 70–99)
Glucose-Capillary: 126 mg/dL — ABNORMAL HIGH (ref 70–99)

## 2022-09-20 LAB — COMPREHENSIVE METABOLIC PANEL
ALT: 21 U/L (ref 0–44)
AST: 14 U/L — ABNORMAL LOW (ref 15–41)
Albumin: 3.1 g/dL — ABNORMAL LOW (ref 3.5–5.0)
Alkaline Phosphatase: 55 U/L (ref 38–126)
Anion gap: 7 (ref 5–15)
BUN: 28 mg/dL — ABNORMAL HIGH (ref 8–23)
CO2: 27 mmol/L (ref 22–32)
Calcium: 9.1 mg/dL (ref 8.9–10.3)
Chloride: 103 mmol/L (ref 98–111)
Creatinine, Ser: 1.17 mg/dL (ref 0.61–1.24)
GFR, Estimated: >60 mL/min (ref >60)
Glucose, Bld: 141 mg/dL — ABNORMAL HIGH (ref 70–99)
Potassium: 3.3 mmol/L — ABNORMAL LOW (ref 3.5–5.1)
Sodium: 137 mmol/L (ref 135–145)
Total Bilirubin: 1.1 mg/dL (ref 0.3–1.2)
Total Protein: 6.3 g/dL — ABNORMAL LOW (ref 6.5–8.1)

## 2022-09-20 LAB — CBC
HCT: 38.8 % — ABNORMAL LOW (ref 39.0–52.0)
Hemoglobin: 12.6 g/dL — ABNORMAL LOW (ref 13.0–17.0)
MCH: 27.5 pg (ref 26.0–34.0)
MCHC: 32.5 g/dL (ref 30.0–36.0)
MCV: 84.5 fL (ref 80.0–100.0)
Platelets: 205 10*3/uL (ref 150–400)
RBC: 4.59 MIL/uL (ref 4.22–5.81)
RDW: 12.5 % (ref 11.5–15.5)
WBC: 13.5 10*3/uL — ABNORMAL HIGH (ref 4.0–10.5)
nRBC: 0 % (ref 0.0–0.2)

## 2022-09-20 LAB — CSF CELL COUNT WITH DIFFERENTIAL
RBC Count, CSF: 1040 /mm3 — ABNORMAL HIGH
RBC Count, CSF: 4 /mm3 — ABNORMAL HIGH
Tube #: 1
Tube #: 4
WBC, CSF: 0 /mm3 (ref 0–5)
WBC, CSF: 1 /mm3 (ref 0–5)

## 2022-09-20 LAB — URINALYSIS, W/ REFLEX TO CULTURE (INFECTION SUSPECTED)
Bilirubin Urine: NEGATIVE
Glucose, UA: >=500 mg/dL — AB
Ketones, ur: 20 mg/dL — AB
Leukocytes,Ua: NEGATIVE
Nitrite: NEGATIVE
Protein, ur: 30 mg/dL — AB
RBC / HPF: >50 RBC/hpf (ref 0–5)
Specific Gravity, Urine: 1.026 (ref 1.005–1.030)
pH: 5 (ref 5.0–8.0)

## 2022-09-20 LAB — PROTEIN AND GLUCOSE, CSF
Glucose, CSF: 100 mg/dL — ABNORMAL HIGH (ref 40–70)
Total  Protein, CSF: 57 mg/dL — ABNORMAL HIGH (ref 15–45)

## 2022-09-20 MED ORDER — CHLORHEXIDINE GLUCONATE CLOTH 2 % EX PADS
6.0000 | MEDICATED_PAD | Freq: Every day | CUTANEOUS | Status: DC
  Administered 2022-09-20 – 2022-09-24 (×5): 6 via TOPICAL

## 2022-09-20 MED ORDER — GERHARDT'S BUTT CREAM
TOPICAL_CREAM | Freq: Two times a day (BID) | CUTANEOUS | Status: DC
  Administered 2022-09-20 – 2022-09-22 (×2): 1 via TOPICAL
  Filled 2022-09-20: qty 1

## 2022-09-20 MED ORDER — VITAMIN B-12 100 MCG PO TABS
500.0000 ug | ORAL_TABLET | Freq: Every day | ORAL | Status: DC
  Administered 2022-09-21 – 2022-09-24 (×4): 500 ug via ORAL
  Filled 2022-09-20 (×4): qty 5

## 2022-09-20 MED ORDER — POTASSIUM CHLORIDE CRYS ER 20 MEQ PO TBCR
60.0000 meq | EXTENDED_RELEASE_TABLET | Freq: Once | ORAL | Status: AC
  Administered 2022-09-20: 60 meq via ORAL
  Filled 2022-09-20: qty 3

## 2022-09-20 MED ORDER — THIAMINE HCL 100 MG/ML IJ SOLN
500.0000 mg | Freq: Three times a day (TID) | INTRAVENOUS | Status: AC
  Administered 2022-09-20 – 2022-09-22 (×6): 500 mg via INTRAVENOUS
  Filled 2022-09-20 (×6): qty 5

## 2022-09-20 MED ORDER — CYANOCOBALAMIN 1000 MCG/ML IJ SOLN
1000.0000 ug | Freq: Once | INTRAMUSCULAR | Status: AC
  Administered 2022-09-20: 1000 ug via INTRAMUSCULAR
  Filled 2022-09-20: qty 1

## 2022-09-20 MED ORDER — THIAMINE HCL 100 MG/ML IJ SOLN
250.0000 mg | Freq: Every day | INTRAVENOUS | Status: AC
  Administered 2022-09-22 – 2022-09-24 (×3): 250 mg via INTRAVENOUS
  Filled 2022-09-20 (×3): qty 2.5

## 2022-09-20 MED ORDER — THIAMINE MONONITRATE 100 MG PO TABS: 100.0000 mg | ORAL_TABLET | Freq: Every day | ORAL | Status: DC

## 2022-09-20 MED ORDER — SODIUM CHLORIDE 0.9 % IV SOLN
1.0000 g | INTRAVENOUS | Status: AC
  Administered 2022-09-20 – 2022-09-22 (×3): 1 g via INTRAVENOUS
  Filled 2022-09-20 (×3): qty 10

## 2022-09-20 MED ORDER — MELATONIN 5 MG PO TABS
5.0000 mg | ORAL_TABLET | Freq: Every evening | ORAL | Status: DC | PRN
  Administered 2022-09-20 – 2022-09-21 (×2): 5 mg via ORAL
  Filled 2022-09-20 (×2): qty 1

## 2022-09-20 NOTE — Plan of Care (Signed)
  Problem: Activity: Goal: Risk for activity intolerance will decrease Outcome: Progressing   Problem: Nutrition: Goal: Adequate nutrition will be maintained Outcome: Progressing   Problem: Coping: Goal: Level of anxiety will decrease Outcome: Progressing   

## 2022-09-20 NOTE — Progress Notes (Signed)
Neurology Progress Note  Brief HPI: 77 year old patient with history of diabetes, hypertension, hyperlipidemia and GERD presented in originally to the ED for an episode of aphasia which lasted about 10 minutes.  Patient stated that this has happened before.  Patient's daughter states that he has been slightly confused since Tuesday and has been having hallucinations of horses and other livestock.  He was noted to be febrile to 100.1 in the ED, and on exam today is disoriented to person place time and situation  Subjective: Patient is disoriented to person place time and situation and is unable to give any history of why he is here.  Exam: Vitals:   09/20/22 0334 09/20/22 0756  BP: 126/75 (!) 142/75  Pulse: 80 77  Resp: 18 18  Temp: (!) 97.5 F (36.4 C) 98 F (36.7 C)  SpO2: 99% 98%   Gen: In bed, NAD Resp: non-labored breathing, no acute distress Abd: soft, nt  Neuro: Mental Status: Disoriented to person, place, time and situation but does interact with examiner, speaking in single words Cranial Nerves: Tracks examiner with eyes around the room, face symmetrical Motor: Moves bilateral upper and lower extremities spontaneously and purposefully Gait: Deferred  Pertinent Labs: CBC    Component Value Date/Time   WBC 7.1 09/18/2022 1024   RBC 4.64 09/18/2022 1024   HGB 13.0 09/18/2022 1024   HCT 38.8 (L) 09/18/2022 1024   PLT 262 09/18/2022 1024   MCV 83.6 09/18/2022 1024   MCH 28.0 09/18/2022 1024   MCHC 33.5 09/18/2022 1024   RDW 12.1 09/18/2022 1024       Latest Ref Rng & Units 09/19/2022   11:15 AM 09/18/2022   10:24 AM 07/04/2016    6:12 PM  BMP  Glucose 70 - 99 mg/dL 161  096  045   BUN 8 - 23 mg/dL 24  14  14    Creatinine 0.61 - 1.24 mg/dL 4.09  8.11  9.14   Sodium 135 - 145 mmol/L 136  135  135   Potassium 3.5 - 5.1 mmol/L 3.3  3.3  3.2   Chloride 98 - 111 mmol/L 102  99  96   CO2 22 - 32 mmol/L 25  29  25    Calcium 8.9 - 10.3 mg/dL 8.8  9.0  9.7   RPR  negative Ammonia 28 TSH 2.482 B12 258 Urine drug screen negative Ethanol negative Thiamine pending Urinalysis negative for UTI  Imaging Reviewed:  CT head: No acute abnormality  CTA head and neck: No LVO or hemodynamically significant stenosis  MRI brain: No acute abnormality, mild chronic microvascular ischemic disease  Assessment: 76 year old patient with history of hypertension, hyperlipidemia, diabetes and GERD presented with episode of aphasia and confusion with hallucinations.  On exam, he is disoriented to person place time and situation.  Patient's daughter states that this began on Tuesday and that he has mild confusion at baseline (such as forgetting where he put objects) but that hallucinations and profound disorientation are new.  B12 level is low for neurological purposes at 258, but this does not explain patient's degree of altered mental status.  He was febrile in the ED, but urinalysis was negative for UTI and he has no symptoms of upper respiratory tract infection.  This raises concern for meningitis or encephalitis given profound confusion and fever.  Plan to perform lumbar puncture later today for evaluation.  Will supplement vitamin B12 as well as thiamine given that lab is not back yet.  Impression: Episode of altered  mental status with hallucinations with differential to include meningitis, encephalitis repeat vitamin deficiency  Recommendations: -Lumbar puncture later today to evaluate for meningitis and encephalitis -Will send cell count on tubes 1 and 4, protein and glucose, meningitis/encephalitis panel, Gram stain and culture -IV cyanocobalamin 1000 mcg today followed by 500 mcg p.o. daily -Thiamine 500 mg IV 3 times daily for 2 days followed by 250 mg once daily for 3 days, followed by 100 mg p.o. daily  Cortney E Ernestina Columbia , MSN, AGACNP-BC [facility] See Amion for schedule and pager information 09/20/2022 9:24 AM    Attending  addendum Patient seen and examined Agree with the exam above.  Labs and imaging personally reviewed. Given his persistent altered mental status which was rather abrupt and no clear explanation, we will do an LP to rule out any infectious or inflammatory etiology. He will need evaluation of his prostate per primary team given concerns for urinary retention. Given the history of prostate enlargement, although no history of prostate cancer, I will send out paraneoplastic panel as a part of workup as well. Plan was discussed with Dr. Rhona Leavens  -- Milon Dikes, MD Neurologist [facility] Pager: [phone]

## 2022-09-20 NOTE — Procedures (Signed)
LUMBAR PUNCTURE (SPINAL TAP) PROCEDURE NOTE  Indication: Altered mental status, rule out CNS infection versus paraneoplastic encephalitis versus autoimmune encephalitis   Proceduralists: Dr. Wilford Corner, assisted by L. Ernestina Columbia NP   Risks, benefits and alternatives of the procedure were dicussed with the patient including but not limited to post-LP headache, bleeding, infection, weakness/numbness of legs(radiculopathy), death.    Consent obtained from: Patient's daughter   Procedure Note The patient was prepped and draped, and using sterile technique a 20 gauge quinke spinal needle was inserted in the L4-5 space.   Opening pressure was not recorded Approximately 16 cc of CSF were obtained and sent for analysis.  Patient tolerated the procedure well and blood loss was minimal.    -- Milon Dikes, MD Neurologist [facility] Pager: [phone]

## 2022-09-20 NOTE — Plan of Care (Signed)
CSF RBC 1040 and 4 in tubes #1 and 4. WBCs 0.1. Protein 57 Glucose 100. Meningitis encephalitis panel negative Gram stain unremarkable thus far   Plan as in the consult note-needs thiamine replacement, B12 replacement as well as follow-up on the send out autoimmune and paraneoplastic panels on CSF and blood.  -- Milon Dikes, MD Neurologist [facility] Pager: [phone]

## 2022-09-20 NOTE — Care Management Obs Status (Signed)
MEDICARE OBSERVATION STATUS NOTIFICATION   Patient Details  Name: Justin Foster MRN: [id number] Date of Birth: [birth year]   Medicare Observation Status Notification Given:  Yes    Kermit Balo, RN 09/20/2022, 9:55 AM

## 2022-09-20 NOTE — Progress Notes (Signed)
Progress Note   Patient: Justin Foster PPI:[id number] DOB: [birth year] DOA: 09/18/2022     0 DOS: the patient was seen and examined on 09/20/2022   Brief hospital course: 77 y.o. male with medical history significant of type 2 diabetes, hypertension, hyperlipidemia, GERD presented to ED for evaluation of transient episode of aphasia yesterday morning at around 7 AM, symptoms resolved in about 10 minutes.  Per family, LKW the night before.  Patient endorsed epigastric abdominal pain, nausea, and vomiting.  He had no acute neurologic deficits on arrival to the ED.  Temperature 100.1 F.  Blood pressure elevated in the ED with systolic above 200.  Labs showing no leukocytosis, potassium 3.3, glucose 211, no elevation of lipase or LFTs, blood ethanol level <10, UDS negative, UA not suggestive of infection, COVID/influenza/RSV PCR negative.  CT head and brain MRI negative for acute intracranial abnormality.  Chest x-ray not suggestive of pneumonia.  CT abdomen pelvis negative for acute findings.  CT did show moderate bladder distention and moderate prostatomegaly which may contribute to the bladder distention. Patient received Tylenol, Zofran, and Ativan in the ED. Neurology consulted.   Assessment and Plan: Transient aphasia Confusion/mild delirium Blood ethanol level <10, UDS negative.   -CT head and brain MRI negative for acute intracranial abnormality.  -CTA head and neck reviewed, neg, MRI brain neg -  B12, TSH, RPR, and ammonia levels unremarkable - Remains confused this AM. Neurology plans for LP today to r/o encephalitis/meningitis   Fever No leukocytosis on labs.  COVID/influenza/RSV PCR negative.   -Chest x-ray not suggestive of pneumonia.   -UA not suggestive of infection.   -f/u blood cx -Of note, pt is retaining significant amount of urine, requiring I/O cath -Remains afebrile at this time   Nausea, Patient endorsed epigastric abdominal discomfort in the ED but denies at this time.    -CT abdomen pelvis unremarkable   Acute urinary retention with ARF -CT showing moderate bladder distention and moderate prostatomegaly which may contribute to the bladder distention.  -Continued to retain urine, thus now has indwelling foley -Recommend cont cath for now and f/u with Urology as outpatient   Mild hypokalemia Replaced Recheck basic metabolic panel in the morning   Type 2 diabetes Cont ssi as needed -Hold oral hypoglycemic regiment while in the hospital -A1c 7.0   Hypertension Blood pressure stable currently Continue home Norvasc 10 mg p.o. daily Hold lisinopril and chlorothiazide given acute renal failure Continue as needed hydralazine IV   Hyperlipidemia -Will continue home simvastatin  GERD Will continue patient with Protonix   Subjective: Remains confused this AM  Physical Exam: Vitals:   09/20/22 0334 09/20/22 0756 09/20/22 1243 09/20/22 1253  BP: 126/75 (!) 142/75 (!) 141/61 135/60  Pulse: 80 77 89 89  Resp: 18 18  19   Temp: (!) 97.5 F (36.4 C) 98 F (36.7 C)  98.5 F (36.9 C)  TempSrc: Oral Oral  Oral  SpO2: 99% 98% 100% 98%  Weight:      Height:       General exam: Conversant, in no acute distress Respiratory system: normal chest rise, clear, no audible wheezing Cardiovascular system: regular rhythm, s1-s2 Gastrointestinal system: Nondistended, nontender, pos BS Central nervous system: No seizures, no tremors Extremities: No cyanosis, no joint deformities Skin: No rashes, no pallor Psychiatry: Affect normal // no auditory hallucinations   Data Reviewed:  Labs reviewed: Na 137, K 3.3, Cr 1.17, WBC 13.5, Hgb 12.6  Family Communication: Patient room, patient's daughter at bedside  Disposition: Status is: Observation The patient will require care spanning > 2 midnights and should be moved to inpatient because: Severity of illness  Planned Discharge Destination: Home     Author: Rickey Barbara, MD 09/20/2022 3:32 PM  For on call  review [email or web address].

## 2022-09-21 ENCOUNTER — Inpatient Hospital Stay (HOSPITAL_COMMUNITY): Payer: Medicare HMO

## 2022-09-21 DIAGNOSIS — R569 Unspecified convulsions: Secondary | ICD-10-CM | POA: Diagnosis not present

## 2022-09-21 DIAGNOSIS — R410 Disorientation, unspecified: Secondary | ICD-10-CM | POA: Diagnosis not present

## 2022-09-21 LAB — CBC
HCT: 36.9 % — ABNORMAL LOW (ref 39.0–52.0)
Hemoglobin: 12.2 g/dL — ABNORMAL LOW (ref 13.0–17.0)
MCH: 28.4 pg (ref 26.0–34.0)
MCHC: 33.1 g/dL (ref 30.0–36.0)
MCV: 85.8 fL (ref 80.0–100.0)
Platelets: 173 10*3/uL (ref 150–400)
RBC: 4.3 MIL/uL (ref 4.22–5.81)
RDW: 12.3 % (ref 11.5–15.5)
WBC: 10.6 10*3/uL — ABNORMAL HIGH (ref 4.0–10.5)
nRBC: 0 % (ref 0.0–0.2)

## 2022-09-21 LAB — COMPREHENSIVE METABOLIC PANEL
ALT: 16 U/L (ref 0–44)
AST: 12 U/L — ABNORMAL LOW (ref 15–41)
Albumin: 2.9 g/dL — ABNORMAL LOW (ref 3.5–5.0)
Alkaline Phosphatase: 50 U/L (ref 38–126)
Anion gap: 10 (ref 5–15)
BUN: 20 mg/dL (ref 8–23)
CO2: 26 mmol/L (ref 22–32)
Calcium: 9.1 mg/dL (ref 8.9–10.3)
Chloride: 102 mmol/L (ref 98–111)
Creatinine, Ser: 1.04 mg/dL (ref 0.61–1.24)
GFR, Estimated: >60 mL/min (ref >60)
Glucose, Bld: 149 mg/dL — ABNORMAL HIGH (ref 70–99)
Potassium: 3.3 mmol/L — ABNORMAL LOW (ref 3.5–5.1)
Sodium: 138 mmol/L (ref 135–145)
Total Bilirubin: 1.1 mg/dL (ref 0.3–1.2)
Total Protein: 6 g/dL — ABNORMAL LOW (ref 6.5–8.1)

## 2022-09-21 LAB — MISC LABCORP TEST (SEND OUT)
Labcorp test code: 505625
Labcorp test code: 505700
Labcorp test code: 505535
Labcorp test code: 505575

## 2022-09-21 LAB — PROCALCITONIN: Procalcitonin: <0.1 ng/mL

## 2022-09-21 LAB — GLUCOSE, CAPILLARY
Glucose-Capillary: 155 mg/dL — ABNORMAL HIGH (ref 70–99)
Glucose-Capillary: 192 mg/dL — ABNORMAL HIGH (ref 70–99)
Glucose-Capillary: 206 mg/dL — ABNORMAL HIGH (ref 70–99)
Glucose-Capillary: 136 mg/dL — ABNORMAL HIGH (ref 70–99)

## 2022-09-21 LAB — C-REACTIVE PROTEIN: CRP: 1.7 mg/dL — ABNORMAL HIGH (ref <1.0)

## 2022-09-21 LAB — SEDIMENTATION RATE: Sed Rate: 12 mm/h (ref 0–16)

## 2022-09-21 MED ORDER — POTASSIUM CHLORIDE CRYS ER 20 MEQ PO TBCR
60.0000 meq | EXTENDED_RELEASE_TABLET | Freq: Once | ORAL | Status: AC
  Administered 2022-09-21: 60 meq via ORAL
  Filled 2022-09-21: qty 3

## 2022-09-21 NOTE — Procedures (Signed)
Patient Name: Justin Foster  MRN: [id number]  Epilepsy Attending: Charlsie Quest  Referring Physician/Provider: Gordy Councilman, MD  Date: 09/21/2022 Duration: 22.51 mins  Patient history: 77yo M with recurrent deja vu getting eeg to evaluate for seizure  Level of alertness: Awake, asleep  AEDs during EEG study: None  Technical aspects: This EEG study was done with scalp electrodes positioned according to the 10-20 International system of electrode placement. Electrical activity was reviewed with band pass filter of 1-70Hz , sensitivity of 7 uV/mm, display speed of 44mm/sec with a 60Hz  notched filter applied as appropriate. EEG data were recorded continuously and digitally stored.  Video monitoring was available and reviewed as appropriate.  Description: The posterior dominant rhythm consists of 8 Hz activity of moderate voltage (25-35 uV) seen predominantly in posterior head regions, symmetric and reactive to eye opening and eye closing. Sleep was characterized by vertex waves, sleep spindles (12 to 14 Hz), maximal frontocentral region. Physiologic photic driving was seen during photic stimulation.  Hyperventilation was not performed.     IMPRESSION: This study is within normal limits. No seizures or epileptiform discharges were seen throughout the recording.  A normal interictal EEG does not exclude the diagnosis of epilepsy.  Shaquia Berkley Annabelle Harman

## 2022-09-21 NOTE — Plan of Care (Signed)
  Problem: Elimination: Goal: Will not experience complications related to urinary retention Outcome: Not Progressing   Problem: Safety: Goal: Ability to remain free from injury will improve Outcome: Not Progressing   Problem: Pain Managment: Goal: General experience of comfort will improve Outcome: Not Progressing   Problem: Skin Integrity: Goal: Risk for impaired skin integrity will decrease Outcome: Not Progressing   Problem: Coping: Goal: Ability to adjust to condition or change in health will improve Outcome: Not Progressing

## 2022-09-21 NOTE — Progress Notes (Signed)
Progress Note   Patient: Justin Foster ZDG:[id number] DOB: 1945/09/13 DOA: 09/18/2022     1 DOS: the patient was seen and examined on 09/21/2022   Brief hospital course: 77 y.o. male with medical history significant of type 2 diabetes, hypertension, hyperlipidemia, GERD presented to ED for evaluation of transient episode of aphasia yesterday morning at around 7 AM, symptoms resolved in about 10 minutes.  Per family, LKW the night before.  Patient endorsed epigastric abdominal pain, nausea, and vomiting.  He had no acute neurologic deficits on arrival to the ED.  Temperature 100.1 F.  Blood pressure elevated in the ED with systolic above 200.  Labs showing no leukocytosis, potassium 3.3, glucose 211, no elevation of lipase or LFTs, blood ethanol level <10, UDS negative, UA not suggestive of infection, COVID/influenza/RSV PCR negative.  CT head and brain MRI negative for acute intracranial abnormality.  Chest x-ray not suggestive of pneumonia.  CT abdomen pelvis negative for acute findings.  CT did show moderate bladder distention and moderate prostatomegaly which may contribute to the bladder distention. Patient received Tylenol, Zofran, and Ativan in the ED. Neurology consulted.   Assessment and Plan: Transient aphasia Confusion/mild delirium Blood ethanol level <10, UDS negative.   -CT head and brain MRI negative for acute intracranial abnormality.  -CTA head and neck reviewed, neg, MRI brain neg -  B12, TSH, RPR, and ammonia levels unremarkable - Remains confused. Pt now s/p LP with send out labs to rule out paraneoplastic disease and autoimmune encephalitis -Seen by Neuro today. Suspicion for dementia   Fever No leukocytosis on labs.  COVID/influenza/RSV PCR negative.   -Chest x-ray not suggestive of pneumonia.   -UA not suggestive of infection.   -f/u blood cx -Of note, pt is retaining significant amount of urine, requiring I/O cath -Remains afebrile at this time -Given trial of rocephin  given jardiance PTA with bladder outlet obstruction. Repeat UA is clear   Nausea, Patient endorsed epigastric abdominal discomfort in the ED but denies at this time.   -CT abdomen pelvis unremarkable   Acute urinary retention with ARF -CT showing moderate bladder distention and moderate prostatomegaly which may contribute to the bladder distention.  -Continued to retain urine, thus now has indwelling foley -Recommend cont cath for now and f/u with Urology as outpatient   Mild hypokalemia Replaced Recheck basic metabolic panel in the morning   Type 2 diabetes Cont ssi as needed -Hold oral hypoglycemic regiment while in the hospital -A1c 7.0   Hypertension Blood pressure stable currently Continue home Norvasc 10 mg p.o. daily Hold lisinopril and chlorothiazide given acute renal failure Continue as needed hydralazine IV   Hyperlipidemia -Will continue home simvastatin  GERD Will continue patient with Protonix   Subjective:Still confused this AM  Physical Exam: Vitals:   09/21/22 0333 09/21/22 0742 09/21/22 1151 09/21/22 1521  BP: 132/79 131/78 (!) 122/52 (!) 142/74  Pulse: 74 65 79 80  Resp: 17 18 18 18   Temp: 98.2 F (36.8 C) 98 F (36.7 C) 98.7 F (37.1 C) 97.8 F (36.6 C)  TempSrc: Oral Oral Oral Oral  SpO2: 98% 97% 98% 98%  Weight:      Height:       General exam: Awake, laying in bed, in nad Respiratory system: Normal respiratory effort, no wheezing Cardiovascular system: regular rate, s1, s2 Gastrointestinal system: Soft, nondistended, positive BS Central nervous system: CN2-12 grossly intact, strength intact Extremities: Perfused, no clubbing Skin: Normal skin turgor, no notable skin lesions seen Psychiatry:  Difficult to assess given mentation  Data Reviewed:  Labs reviewed: Na 138, K 3.3, Cr 1.04, WBC 10.6, Hgb 12.2  Family Communication: Patient room, patient's daughter at bedside  Disposition: Status is: Inpatient Continue inpatient stay  because: Severity of illness  Planned Discharge Destination: Home     Author: Rickey Barbara, MD 09/21/2022 4:47 PM  For on call review [email or web address].

## 2022-09-21 NOTE — Progress Notes (Addendum)
Neurology Progress Note  Subjective: Remains confused but able to identify daughter, read date from the whiteboard  Remote memory clearer than recent recall  Daughter notes that family stopped the patient from driving 1 year ago due to memory concerns, he moved in with her 2 months ago due to further cognitive decline and this decline has gradually continued. 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  Complaining of recurrent de ja vu  Exam: Current vital signs: BP 131/78 (BP Location: Right Arm)   Pulse 65   Temp 98 F (36.7 C) (Oral)   Resp 18   Ht 5\' 9"  (1.753 m)   Wt 64.4 kg   SpO2 97%   BMI 20.97 kg/m  Vital signs in last 24 hours: Temp:  [98 F (36.7 C)-98.7 F (37.1 C)] 98 F (36.7 C) (09/06 0742) Pulse Rate:  [65-90] 65 (09/06 0742) Resp:  [17-19] 18 (09/06 0742) BP: (131-149)/(60-85) 131/78 (09/06 0742) SpO2:  [97 %-100 %] 97 % (09/06 0742)   Gen: In bed, comfortable  Resp: non-labored breathing, no grossly audible wheezing Cardiac: Perfusing extremities well  Abd: soft, nt  Neuro: MS: Awake, alert conversant follows simple commands  CN: Hearing intact to voice, face symmetric, tracks examiner Motor: Pill rolling RUE intermittently at rest. Using bilateral UE spontaneously to adjust covers etc.   Pertinent Labs: CSF with RBC 1040/4, WBC 1/0, Pro 54, Glu 100 (serum 217)  M/E PCR panel is negative B1 pending B12 258 TSH normal Ammonia normal RPR neg  Basic Metabolic Panel: Recent Labs  Lab 09/18/22 1024 09/19/22 1115 09/20/22 0857 09/21/22 0637  NA 135 136 137 138  K 3.3* 3.3* 3.3* 3.3*  CL 99 102 103 102  CO2 29 25 27 26   GLUCOSE 211* 212* 141* 149*  BUN 14 24* 28* 20  CREATININE 0.83 1.59* 1.17 1.04  CALCIUM 9.0 8.8* 9.1 9.1  MG  --  1.8  --   --     CBC: Recent Labs  Lab 09/18/22 1024 09/20/22 0857 09/21/22 0637  WBC 7.1 13.5* 10.6*  HGB 13.0 12.6* 12.2*  HCT 38.8* 38.8* 36.9*  MCV 83.6  84.5 85.8  PLT 262 205 173    Coagulation Studies: No results for input(s): "LABPROT", "INR" in the last 72 hours.   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 Paraneoplastic panels will take time to result but currently given clinical history and exam I feel risks of empiric encephalitis treatments would outweigh benefit. Marcille Blanco vu can be seizure aura / focus in temporal lobe epilepsy so I think it is reasonable to repeat routine EEG today to increase sensitivity of detecting an underlying epilepsy.   Recommendations: - Continue empiric thiamine (reduce to 100 mg daily on discharge or discontinue if level results normal) - Continue B12 supplementation for goal > 400 - Repeat EEG due to recurrent de ja vu, if this is negative, inpatient neurology will sign off. Please do not hesitate to reach out with any questions or concerns - Outpatient neurology follow-up, referral to [facility] for 2-4 week follow-up placed   [facility] MD-PhD [facility] [phone]

## 2022-09-21 NOTE — Evaluation (Signed)
Physical Therapy Evaluation Patient Details Name: Justin Foster MRN: [id number] DOB: May 28, 1945 Today's Date: 09/21/2022  History of Present Illness  77 y.o. male presented 9/3 to ED for evaluation of transient episode of aphasia. Found to have fever, Confusion/mild delirium. with medical history significant of type 2 diabetes, hypertension, hyperlipidemia, GERD.  Clinical Impression  Pt admitted with above diagnosis. Daughter present and helpful with providing history. Reports pt is typically quite sharp but today he is disoriented to date, place, and situation; oriented to self and family only. Typically very active, performs yard work, no longer drives (past year has allowed son to drive due to memory deficits.) Pt mobilizing near baseline with mild abnormalities, showing slight instability but able to self correct without assistive device today. Repeatedly having episodes of Dj vu. Medical team notified. Suspect as medical issues resolve he will be safe to return home with family supervision. No equipment from PT currently recognized as necessary. At present, he would need 24/7 supervision due to impaired cognition for safety - consider increased family support or aide.Will follow during admission. Pt will benefit from acute skilled PT to increase their independence and safety with mobility to allow discharge.           If plan is discharge home, recommend the following: Supervision due to cognitive status;Assist for transportation;Help with stairs or ramp for entrance;Assistance with cooking/housework   Can travel by private vehicle     yes    Equipment Recommendations None recommended by PT     Functional Status Assessment Patient has had a recent decline in their functional status and demonstrates the ability to make significant improvements in function in a reasonable and predictable amount of time.     Precautions / Restrictions Precautions Precautions: Fall Restrictions Weight  Bearing Restrictions: No      Mobility  Bed Mobility Overal bed mobility: Needs Assistance Bed Mobility: Supine to Sit, Sit to Supine     Supine to sit: Supervision Sit to supine: Supervision   General bed mobility comments: Supervision for safety, difficulty sequencing LEs out of bed. Cues for technique, no physical assist needed.    Transfers Overall transfer level: Needs assistance Equipment used: Rolling walker (2 wheels), None Transfers: Sit to/from Stand Sit to Stand: Supervision           General transfer comment: Supervision for safety, initially with walker, practiced later without RW and remains stable without LOB.    Ambulation/Gait Ambulation/Gait assistance: Contact guard assist Gait Distance (Feet): 100 Feet Assistive device: Rolling walker (2 wheels), None Gait Pattern/deviations: Step-through pattern, Narrow base of support, Scissoring, Drifts right/left Gait velocity: decr Gait velocity interpretation: <1.8 ft/sec, indicate of risk for recurrent falls   General Gait Details: Stable with RW for support after educating on use. Without RW, pt shows very mild instability, minor drift from straight path, and one episode of sciossoring but able to self correct. CGA for safety. Able to perform lower level dynamic challenges without LOB. Cues for awareness. Feels calves were tight but improved a fair amount with ambulation.  Stairs            Wheelchair Mobility     Tilt Bed    Modified Rankin (Stroke Patients Only)       Balance Overall balance assessment: Mild deficits observed, not formally tested  Pertinent Vitals/Pain Pain Assessment Pain Assessment: No/denies pain    Home Living Family/patient expects to be discharged to:: Private residence Living Arrangements: Children (daughter and son in law) Available Help at Discharge: Family;Available PRN/intermittently Type of Home:  House Home Access: Stairs to enter Entrance Stairs-Rails: None Entrance Stairs-Number of Steps: 2   Home Layout: One level Home Equipment: None      Prior Function Prior Level of Function : Independent/Modified Independent             Mobility Comments: ind; active; yardwork ADLs Comments: ind active, IADLs, but Son drives him places due to memory issues over the past year.     Extremity/Trunk Assessment   Upper Extremity Assessment Upper Extremity Assessment: Defer to OT evaluation    Lower Extremity Assessment Lower Extremity Assessment: Generalized weakness       Communication   Communication Communication: No apparent difficulties  Cognition Arousal: Alert Behavior During Therapy: Flat affect Overall Cognitive Status: Impaired/Different from baseline Area of Impairment: Orientation, Problem solving, Following commands                 Orientation Level: Disoriented to, Place, Time, Situation     Following Commands: Follows one step commands inconsistently, Follows one step commands with increased time     Problem Solving: Slow processing, Decreased initiation, Difficulty sequencing, Requires verbal cues General Comments: Having several episodes of Dj vu        General Comments General comments (skin integrity, edema, etc.): daughter in room, supportive    Exercises     Assessment/Plan    PT Assessment Patient needs continued PT services  PT Problem List Decreased activity tolerance;Decreased balance;Decreased mobility;Decreased cognition;Decreased knowledge of use of DME;Decreased safety awareness;Decreased knowledge of precautions;Decreased strength       PT Treatment Interventions DME instruction;Gait training;Stair training;Functional mobility training;Therapeutic activities;Therapeutic exercise;Balance training;Neuromuscular re-education;Cognitive remediation;Patient/family education    PT Goals (Current goals can be found in the Care  Plan section)  Acute Rehab PT Goals Patient Stated Goal: improve cognition, go home PT Goal Formulation: With patient/family Time For Goal Achievement: 10/05/22 Potential to Achieve Goals: Good    Frequency Min 1X/week     Co-evaluation               AM-PAC PT "6 Clicks" Mobility  Outcome Measure Help needed turning from your back to your side while in a flat bed without using bedrails?: A Little Help needed moving from lying on your back to sitting on the side of a flat bed without using bedrails?: A Little Help needed moving to and from a bed to a chair (including a wheelchair)?: A Little Help needed standing up from a chair using your arms (e.g., wheelchair or bedside chair)?: A Little Help needed to walk in hospital room?: A Little Help needed climbing 3-5 steps with a railing? : A Little 6 Click Score: 18    End of Session Equipment Utilized During Treatment: Gait belt Activity Tolerance: Patient tolerated treatment well Patient left: in bed;with call bell/phone within reach;with bed alarm set;Other (comment);with family/visitor present (Lab in room) Nurse Communication: Mobility status PT Visit Diagnosis: Unsteadiness on feet (R26.81);Other abnormalities of gait and mobility (R26.89);Other symptoms and signs involving the nervous system (R29.898)    Time: 5784-6962 PT Time Calculation (min) (ACUTE ONLY): 22 min   Charges:   PT Evaluation $PT Eval Low Complexity: 1 Low   PT General Charges $$ ACUTE PT VISIT: 1 Visit  Kathlyn [place], PT, DPT [facility]  Rehabilitation Services Physical Therapist Office: [phone] Website: [email or web address]   Berton Mount 09/21/2022, 11:43 AM

## 2022-09-21 NOTE — Progress Notes (Signed)
EEG complete - results pending 

## 2022-09-22 DIAGNOSIS — G459 Transient cerebral ischemic attack, unspecified: Secondary | ICD-10-CM | POA: Diagnosis not present

## 2022-09-22 DIAGNOSIS — R410 Disorientation, unspecified: Secondary | ICD-10-CM | POA: Diagnosis not present

## 2022-09-22 DIAGNOSIS — R112 Nausea with vomiting, unspecified: Secondary | ICD-10-CM | POA: Diagnosis not present

## 2022-09-22 LAB — GLUCOSE, CAPILLARY
Glucose-Capillary: 191 mg/dL — ABNORMAL HIGH (ref 70–99)
Glucose-Capillary: 243 mg/dL — ABNORMAL HIGH (ref 70–99)
Glucose-Capillary: 207 mg/dL — ABNORMAL HIGH (ref 70–99)
Glucose-Capillary: 220 mg/dL — ABNORMAL HIGH (ref 70–99)

## 2022-09-22 LAB — COMPREHENSIVE METABOLIC PANEL
ALT: 16 U/L (ref 0–44)
AST: 12 U/L — ABNORMAL LOW (ref 15–41)
Albumin: 3.1 g/dL — ABNORMAL LOW (ref 3.5–5.0)
Alkaline Phosphatase: 55 U/L (ref 38–126)
Anion gap: 8 (ref 5–15)
BUN: 20 mg/dL (ref 8–23)
CO2: 25 mmol/L (ref 22–32)
Calcium: 9.1 mg/dL (ref 8.9–10.3)
Chloride: 103 mmol/L (ref 98–111)
Creatinine, Ser: 1.09 mg/dL (ref 0.61–1.24)
GFR, Estimated: >60 mL/min (ref >60)
Glucose, Bld: 286 mg/dL — ABNORMAL HIGH (ref 70–99)
Potassium: 4 mmol/L (ref 3.5–5.1)
Sodium: 136 mmol/L (ref 135–145)
Total Bilirubin: 0.8 mg/dL (ref 0.3–1.2)
Total Protein: 6.7 g/dL (ref 6.5–8.1)

## 2022-09-22 LAB — CBC
HCT: 38.6 % — ABNORMAL LOW (ref 39.0–52.0)
Hemoglobin: 12.4 g/dL — ABNORMAL LOW (ref 13.0–17.0)
MCH: 27.2 pg (ref 26.0–34.0)
MCHC: 32.1 g/dL (ref 30.0–36.0)
MCV: 84.6 fL (ref 80.0–100.0)
Platelets: 187 10*3/uL (ref 150–400)
RBC: 4.56 MIL/uL (ref 4.22–5.81)
RDW: 12.3 % (ref 11.5–15.5)
WBC: 9 10*3/uL (ref 4.0–10.5)
nRBC: 0 % (ref 0.0–0.2)

## 2022-09-22 MED ORDER — HALOPERIDOL LACTATE 5 MG/ML IJ SOLN
5.0000 mg | Freq: Once | INTRAMUSCULAR | Status: AC
  Administered 2022-09-22: 5 mg via INTRAVENOUS
  Filled 2022-09-22: qty 1

## 2022-09-22 MED ORDER — QUETIAPINE FUMARATE 25 MG PO TABS
25.0000 mg | ORAL_TABLET | Freq: Every day | ORAL | Status: DC
  Administered 2022-09-22 – 2022-09-23 (×2): 25 mg via ORAL
  Filled 2022-09-22 (×2): qty 1

## 2022-09-22 MED ORDER — POTASSIUM CHLORIDE CRYS ER 20 MEQ PO TBCR
60.0000 meq | EXTENDED_RELEASE_TABLET | Freq: Once | ORAL | Status: AC
  Administered 2022-09-22: 60 meq via ORAL
  Filled 2022-09-22: qty 3

## 2022-09-22 NOTE — Evaluation (Signed)
Occupational Therapy Evaluation Patient Details Name: Justin Foster MRN: [id number] DOB: [birth year] Today's Date: 09/22/2022   History of Present Illness 77 y.o. male presented 9/3 to ED for evaluation of transient episode of aphasia. Found to have fever, Confusion/mild delirium. EEG negative. with medical history significant of type 2 diabetes, hypertension, hyperlipidemia, GERD.   Clinical Impression   PTA patient independent with Adls, mobility but not driving.  Daughter reports recent decline in memory and now lives with her, but he has still been managing ADLs, yard work, some difficulty with med mgmt recently. Today, he is oriented to self and place (but not time, situation or his age), when focused he is able to follow 1 step commands with increased time, demonstrates difficulty with recall and problem solving. He completes ADLs with up to min assist and transfers/mobility with min guard. Based on performance today, recommend 24/7 support at dc and assist for all IADLs (meds, meds, fiances).  Do not anticipate need for OT at dc, but will follow acutely to optimize safety and independence prior to dc home.       If plan is discharge home, recommend the following: A little help with walking and/or transfers;A little help with bathing/dressing/bathroom;Assistance with cooking/housework;Direct supervision/assist for medications management;Assist for transportation;Direct supervision/assist for financial management;Help with stairs or ramp for entrance;Supervision due to cognitive status    Functional Status Assessment  Patient has had a recent decline in their functional status and demonstrates the ability to make significant improvements in function in a reasonable and predictable amount of time.  Equipment Recommendations  BSC/3in1    Recommendations for Other Services       Precautions / Restrictions Precautions Precautions: Fall Restrictions Weight Bearing Restrictions: No       Mobility Bed Mobility Overal bed mobility: Needs Assistance Bed Mobility: Supine to Sit     Supine to sit: Supervision     General bed mobility comments: increased time but no physical assist required    Transfers Overall transfer level: Needs assistance   Transfers: Sit to/from Stand Sit to Stand: Contact guard assist           General transfer comment: no AD, min guard for safety due to mild instability      Balance Overall balance assessment: Mild deficits observed, not formally tested                                         ADL either performed or assessed with clinical judgement   ADL Overall ADL's : Needs assistance/impaired     Grooming: Minimal assistance;Wash/dry hands;Sitting           Upper Body Dressing : Sitting;Set up   Lower Body Dressing: Contact guard assist;Sit to/from stand Lower Body Dressing Details (indicate cue type and reason): increased time and effort to don socks but no physical assist required Toilet Transfer: Contact guard assist;Ambulation           Functional mobility during ADLs: Contact guard assist;Cueing for safety       Vision   Vision Assessment?: No apparent visual deficits     Perception         Praxis         Pertinent Vitals/Pain Pain Assessment Pain Assessment: No/denies pain     Extremity/Trunk Assessment Upper Extremity Assessment Upper Extremity Assessment: Generalized weakness   Lower Extremity Assessment Lower Extremity Assessment: Defer to  PT evaluation       Communication Communication Communication: No apparent difficulties   Cognition Arousal: Alert Behavior During Therapy: Flat affect Overall Cognitive Status: Impaired/Different from baseline Area of Impairment: Orientation, Problem solving, Following commands, Attention, Memory, Safety/judgement, Awareness                 Orientation Level: Disoriented to, Time, Situation Current Attention Level:  Sustained Memory: Decreased recall of precautions, Decreased short-term memory Following Commands: Follows one step commands inconsistently, Follows one step commands with increased time Safety/Judgement: Decreased awareness of safety, Decreased awareness of deficits Awareness: Intellectual Problem Solving: Slow processing, Decreased initiation, Difficulty sequencing, Requires verbal cues General Comments: Pt oriented to hopstial today, able to recall 2/3 words after cueing with delay.  He follows simple commands with increased time when not distracted but requires cueing for problem sovling.     General Comments  daughter in room and supportive, reinforced need for 24/7 supervision and assist with meds, meals, finances    Exercises     Shoulder Instructions      Home Living Family/patient expects to be discharged to:: Private residence Living Arrangements: Children Available Help at Discharge: Family;Available PRN/intermittently Type of Home: House Home Access: Stairs to enter [employer] of Steps: 2 Entrance Stairs-Rails: None Home Layout: One level     Bathroom Shower/Tub: Chief Strategy Officer: Standard     Home Equipment: None          Prior Functioning/Environment Prior Level of Function : Independent/Modified Independent             Mobility Comments: ind; active; yardwork ADLs Comments: ind active, IADLs, but Son drives him places due to memory issues over the past year.        OT Problem List: Decreased activity tolerance;Impaired balance (sitting and/or standing);Decreased coordination;Decreased cognition;Decreased safety awareness      OT Treatment/Interventions: Self-care/ADL training;DME and/or AE instruction;Therapeutic activities;Cognitive remediation/compensation;Balance training;Patient/family education    OT Goals(Current goals can be found in the care plan section) Acute Rehab OT Goals Patient Stated Goal: none  stated OT Goal Formulation: Patient unable to participate in goal setting Time For Goal Achievement: 10/06/22 Potential to Achieve Goals: Good  OT Frequency: Min 1X/week    Co-evaluation              AM-PAC OT "6 Clicks" Daily Activity     Outcome Measure Help from another person eating meals?: A Little Help from another person taking care of personal grooming?: A Little Help from another person toileting, which includes using toliet, bedpan, or urinal?: A Little Help from another person bathing (including washing, rinsing, drying)?: A Little Help from another person to put on and taking off regular upper body clothing?: A Little Help from another person to put on and taking off regular lower body clothing?: A Little 6 Click Score: 18   End of Session Equipment Utilized During Treatment: Gait belt Nurse Communication: Mobility status  Activity Tolerance: Patient tolerated treatment well Patient left: in chair;with call bell/phone within reach;with chair alarm set;with family/visitor present  OT Visit Diagnosis: Unsteadiness on feet (R26.81);Other symptoms and signs involving cognitive function                Time: 0454-0981 OT Time Calculation (min): 28 min Charges:  OT General Charges $OT Visit: 1 Visit OT Evaluation $OT Eval Moderate Complexity: 1 Mod OT Treatments $Self Care/Home Management : 8-22 mins  Barry Brunner, OT Acute Rehabilitation Services Office [phone]  Justin Foster 09/22/2022, 1:37 PM

## 2022-09-22 NOTE — Progress Notes (Signed)
Progress Note   Patient: Justin Foster [id number]:[id number] DOB: [birth year] DOA: 09/18/2022     2 DOS: the patient was seen and examined on 09/22/2022   Brief hospital course: 77 y.o. male with medical history significant of type 2 diabetes, hypertension, hyperlipidemia, GERD presented to ED for evaluation of transient episode of aphasia yesterday morning at around 7 AM, symptoms resolved in about 10 minutes.  Per family, LKW the night before.  Patient endorsed epigastric abdominal pain, nausea, and vomiting.  He had no acute neurologic deficits on arrival to the ED.  Temperature 100.1 F.  Blood pressure elevated in the ED with systolic above 200.  Labs showing no leukocytosis, potassium 3.3, glucose 211, no elevation of lipase or LFTs, blood ethanol level <10, UDS negative, UA not suggestive of infection, COVID/influenza/RSV PCR negative.  CT head and brain MRI negative for acute intracranial abnormality.  Chest x-ray not suggestive of pneumonia.  CT abdomen pelvis negative for acute findings.  CT did show moderate bladder distention and moderate prostatomegaly which may contribute to the bladder distention. Patient received Tylenol, Zofran, and Ativan in the ED. Neurology consulted.   Assessment and Plan: Transient aphasia Confusion/mild delirium Blood ethanol level <10, UDS negative.   -CT head and brain MRI negative for acute intracranial abnormality.  -CTA head and neck reviewed, neg, MRI brain neg -  B12, TSH, RPR, and ammonia levels unremarkable - Remains confused. Pt now s/p LP with send out labs to rule out paraneoplastic disease and autoimmune encephalitis -Seen by Neuro today. Suspicion for dementia with recs to f/u with Neuro as outpatient -Family reports that patient has very poor sleep habits even prior to admit. Pt noted to be very confused and agitated overnight, not improved with melatonin -Most recent EKG reviewed. Will give trial of seroquel qhs   Fever No leukocytosis on labs.   COVID/influenza/RSV PCR negative.   -Chest x-ray not suggestive of pneumonia.   -UA not suggestive of infection.   -f/u blood cx -Of note, pt is retaining significant amount of urine, requiring I/O cath -Remains afebrile at this time -Given trial of rocephin given jardiance PTA with bladder outlet obstruction. Repeat UA is clear   Nausea, Patient endorsed epigastric abdominal discomfort in the ED but denies at this time.   -CT abdomen pelvis unremarkable   Acute urinary retention with ARF -CT showing moderate bladder distention and moderate prostatomegaly which may contribute to the bladder distention.  -Continued to retain urine, thus now has indwelling foley -Recommend cont cath for now and f/u with Urology as outpatient   Mild hypokalemia Replaced Recheck basic metabolic panel in the morning   Type 2 diabetes Cont ssi as needed -Hold oral hypoglycemic regiment while in the hospital -A1c 7.0   Hypertension Blood pressure stable currently Continue home Norvasc 10 mg p.o. daily Hold lisinopril and chlorothiazide given acute renal failure Continue as needed hydralazine IV   Hyperlipidemia -Will continue home simvastatin  GERD Will continue patient with Protonix   Subjective:Remains somewhat confused this AM. Reportedly had very bad night overnight, involving increased agitation  Physical Exam: Vitals:   09/22/22 0320 09/22/22 0825 09/22/22 1100 09/22/22 1535  BP: (!) 117/56 (!) 158/70 129/75 (!) 161/81  Pulse: 64 82 81 72  Resp: 14 14 16 14   Temp: 97.7 F (36.5 C) 98.2 F (36.8 C) 98 F (36.7 C) 98 F (36.7 C)  TempSrc: Axillary Oral Oral Oral  SpO2: 98% 100% 100% 99%  Weight:      Height:  General exam: Conversant, in no acute distress Respiratory system: normal chest rise, clear, no audible wheezing Cardiovascular system: regular rhythm, s1-s2 Gastrointestinal system: Nondistended, nontender, pos BS Central nervous system: No seizures, no  tremors Extremities: No cyanosis, no joint deformities Skin: No rashes, no pallor Psychiatry: Affect normal // no auditory hallucinations   Data Reviewed:  Labs reviewed: Na 136, K 4.0, Cr 1.09, WBC 9.0, Hgb 12.4  Family Communication: Patient room, patient's daughter at bedside  Disposition: Status is: Inpatient Continue inpatient stay because: Severity of illness  Planned Discharge Destination: Home     Author: Rickey Barbara, MD 09/22/2022 3:36 PM  For on call review [email or web address].

## 2022-09-22 NOTE — Plan of Care (Signed)
  Problem: Activity: Goal: Risk for activity intolerance will decrease Outcome: Not Progressing   Problem: Elimination: Goal: Will not experience complications related to urinary retention Outcome: Not Progressing   Problem: Skin Integrity: Goal: Risk for impaired skin integrity will decrease Outcome: Not Progressing   Problem: Safety: Goal: Ability to remain free from injury will improve Outcome: Not Progressing

## 2022-09-22 NOTE — Plan of Care (Signed)
  Problem: Clinical Measurements: Goal: Will remain free from infection Outcome: Progressing Goal: Cardiovascular complication will be avoided Outcome: Progressing   Problem: Coping: Goal: Level of anxiety will decrease Outcome: Progressing   Problem: Elimination: Goal: Will not experience complications related to bowel motility Outcome: Progressing   Problem: Pain Managment: Goal: General experience of comfort will improve Outcome: Progressing   Problem: Safety: Goal: Ability to remain free from injury will improve Outcome: Progressing   Problem: Nutrition: Goal: Adequate nutrition will be maintained Outcome: Not Progressing Note: Small appetite.   Problem: Elimination: Goal: Will not experience complications related to urinary retention Outcome: Not Progressing Note: Indwelling catheter, due to urinary retention.

## 2022-09-22 NOTE — Progress Notes (Signed)
Pt has multiple teeth decaying.  Noticed an bad oral odor.   Brushed teeth with suction tooth brush, and the odor was less noticeable.  Patient denied pain in the decaying right, back teeth.

## 2022-09-23 DIAGNOSIS — R112 Nausea with vomiting, unspecified: Secondary | ICD-10-CM | POA: Diagnosis not present

## 2022-09-23 DIAGNOSIS — R410 Disorientation, unspecified: Secondary | ICD-10-CM | POA: Diagnosis not present

## 2022-09-23 DIAGNOSIS — G459 Transient cerebral ischemic attack, unspecified: Secondary | ICD-10-CM | POA: Diagnosis not present

## 2022-09-23 LAB — GLUCOSE, CAPILLARY
Glucose-Capillary: 195 mg/dL — ABNORMAL HIGH (ref 70–99)
Glucose-Capillary: 219 mg/dL — ABNORMAL HIGH (ref 70–99)
Glucose-Capillary: 232 mg/dL — ABNORMAL HIGH (ref 70–99)
Glucose-Capillary: 239 mg/dL — ABNORMAL HIGH (ref 70–99)

## 2022-09-23 LAB — CSF CULTURE W GRAM STAIN: Culture: NO GROWTH

## 2022-09-23 MED ORDER — KETOROLAC TROMETHAMINE 15 MG/ML IJ SOLN
15.0000 mg | Freq: Four times a day (QID) | INTRAMUSCULAR | Status: DC | PRN
  Administered 2022-09-23 – 2022-09-24 (×2): 15 mg via INTRAVENOUS
  Filled 2022-09-23 (×2): qty 1

## 2022-09-23 NOTE — Progress Notes (Signed)
Discontinued foley as per MD order, patient tolerated well, will continue to monitor

## 2022-09-23 NOTE — Plan of Care (Signed)
  Problem: Education: Goal: Knowledge of General Education information will improve Description: Including pain rating scale, medication(s)/side effects and non-pharmacologic comfort measures Outcome: Progressing   Problem: Clinical Measurements: Goal: Cardiovascular complication will be avoided Outcome: Progressing   Problem: Activity: Goal: Risk for activity intolerance will decrease Outcome: Progressing   Problem: Nutrition: Goal: Adequate nutrition will be maintained Outcome: Progressing   Problem: Coping: Goal: Level of anxiety will decrease Outcome: Progressing   Problem: Elimination: Goal: Will not experience complications related to bowel motility Outcome: Progressing   Problem: Skin Integrity: Goal: Risk for impaired skin integrity will decrease Outcome: Progressing

## 2022-09-23 NOTE — Plan of Care (Signed)
  Problem: Education: Goal: Knowledge of General Education information will improve Description Including pain rating scale, medication(s)/side effects and non-pharmacologic comfort measures Outcome: Progressing   

## 2022-09-23 NOTE — Progress Notes (Signed)
Progress Note   Patient: Justin Foster ZOX:[id number] DOB: 1945/02/22 DOA: 09/18/2022     3 DOS: the patient was seen and examined on 09/23/2022   Brief hospital course: 77 y.o. male with medical history significant of type 2 diabetes, hypertension, hyperlipidemia, GERD presented to ED for evaluation of transient episode of aphasia yesterday morning at around 7 AM, symptoms resolved in about 10 minutes.  Per family, LKW the night before.  Patient endorsed epigastric abdominal pain, nausea, and vomiting.  He had no acute neurologic deficits on arrival to the ED.  Temperature 100.1 F.  Blood pressure elevated in the ED with systolic above 200.  Labs showing no leukocytosis, potassium 3.3, glucose 211, no elevation of lipase or LFTs, blood ethanol level <10, UDS negative, UA not suggestive of infection, COVID/influenza/RSV PCR negative.  CT head and brain MRI negative for acute intracranial abnormality.  Chest x-ray not suggestive of pneumonia.  CT abdomen pelvis negative for acute findings.  CT did show moderate bladder distention and moderate prostatomegaly which may contribute to the bladder distention. Patient received Tylenol, Zofran, and Ativan in the ED. Neurology consulted.   Assessment and Plan: Transient aphasia Confusion/mild delirium Blood ethanol level <10, UDS negative.   -CT head and brain MRI negative for acute intracranial abnormality.  -CTA head and neck reviewed, neg, MRI brain neg -  B12, TSH, RPR, and ammonia levels unremarkable -Pt now s/p LP with send out labs to rule out paraneoplastic disease and autoimmune encephalitis -Seen by Neuro. Suspicion for dementia with recs to f/u with Neuro as outpatient -Family reports that patient has very poor sleep habits even prior to admit.  -Much improvement after addition of at bedtime seroquel. More oriented this AM   Fever - resolved No leukocytosis on labs.  COVID/influenza/RSV PCR negative.   -Chest x-ray not suggestive of pneumonia.    -UA not suggestive of infection.   -f/u blood cx -Of note, pt is retaining significant amount of urine, requiring I/O cath -Remains afebrile at this time -Given trial of rocephin given jardiance PTA with bladder outlet obstruction. Repeat UA is clear   Nausea, Patient endorsed epigastric abdominal discomfort in the ED but denies at this time.   -CT abdomen pelvis unremarkable   Acute urinary retention with ARF -CT showing moderate bladder distention and moderate prostatomegaly which may contribute to the bladder distention.  -had been continued with indwelling foley -As mentation has improved, will attempt voiding trial -Will still recommend f/u with Urology as outpatient   Mild hypokalemia Replaced Recheck basic metabolic panel in the morning   Type 2 diabetes Cont ssi as needed -Hold oral hypoglycemic regiment while in the hospital -A1c 7.0   Hypertension Blood pressure stable currently Continue home Norvasc 10 mg p.o. daily Held lisinopril and chlorothiazide given acute renal failure Continue as needed hydralazine IV   Hyperlipidemia -Will continue home simvastatin  GERD Will continue patient with Protonix   Subjective:Slept better overnight with seroquel. More oriented this AM  Physical Exam: Vitals:   09/23/22 0311 09/23/22 0734 09/23/22 1114 09/23/22 1526  BP: (!) 154/75 (!) 147/75 131/81 135/85  Pulse: 74 78 85 84  Resp: 14 16 16 14   Temp: 98.1 F (36.7 C) 98 F (36.7 C) 97.9 F (36.6 C) 98 F (36.7 C)  TempSrc: Oral Oral    SpO2: 98% 99% 99% 99%  Weight:      Height:       General exam: Awake, laying in bed, in nad Respiratory system:  Normal respiratory effort, no wheezing Cardiovascular system: regular rate, s1, s2 Gastrointestinal system: Soft, nondistended, positive BS Central nervous system: CN2-12 grossly intact, strength intact Extremities: Perfused, no clubbing Skin: Normal skin turgor, no notable skin lesions seen Psychiatry: Mood  normal // no visual hallucinations   Data Reviewed:  There are no new results to review at this time.  Family Communication: Patient room, patient's daughter at bedside  Disposition: Status is: Inpatient Continue inpatient stay because: Severity of illness  Planned Discharge Destination: Home     Author: Rickey Barbara, MD 09/23/2022 4:24 PM  For on call review [email or web address].

## 2022-09-24 ENCOUNTER — Other Ambulatory Visit (HOSPITAL_COMMUNITY): Payer: Self-pay

## 2022-09-24 DIAGNOSIS — R112 Nausea with vomiting, unspecified: Secondary | ICD-10-CM | POA: Diagnosis not present

## 2022-09-24 DIAGNOSIS — R410 Disorientation, unspecified: Secondary | ICD-10-CM | POA: Diagnosis not present

## 2022-09-24 DIAGNOSIS — G459 Transient cerebral ischemic attack, unspecified: Secondary | ICD-10-CM | POA: Diagnosis not present

## 2022-09-24 LAB — CULTURE, BLOOD (ROUTINE X 2)
Culture: NO GROWTH
Culture: NO GROWTH
Special Requests: ADEQUATE

## 2022-09-24 LAB — GLUCOSE, CAPILLARY
Glucose-Capillary: 165 mg/dL — ABNORMAL HIGH (ref 70–99)
Glucose-Capillary: 225 mg/dL — ABNORMAL HIGH (ref 70–99)

## 2022-09-24 LAB — VITAMIN B1: Vitamin B1 (Thiamine): 116.6 nmol/L (ref 66.5–200.0)

## 2022-09-24 MED ORDER — TAMSULOSIN HCL 0.4 MG PO CAPS
0.4000 mg | ORAL_CAPSULE | Freq: Every day | ORAL | Status: DC
  Administered 2022-09-24: 0.4 mg via ORAL
  Filled 2022-09-24: qty 1

## 2022-09-24 MED ORDER — QUETIAPINE FUMARATE 25 MG PO TABS
25.0000 mg | ORAL_TABLET | Freq: Every day | ORAL | 0 refills | Status: AC
  Filled 2022-09-24: qty 30, 30d supply, fill #0

## 2022-09-24 MED ORDER — TAMSULOSIN HCL 0.4 MG PO CAPS
0.4000 mg | ORAL_CAPSULE | Freq: Every day | ORAL | 0 refills | Status: AC
  Filled 2022-09-24: qty 30, 30d supply, fill #0

## 2022-09-24 NOTE — Progress Notes (Signed)
Occupational Therapy Treatment Patient Details Name: Justin Foster MRN: [id number] DOB: 04-22-1945 Today's Date: 09/24/2022   History of present illness 77 y.o. male presented 9/3 to ED for evaluation of transient episode of aphasia. Found to have fever, Confusion/mild delirium. EEG negative. with medical history significant of type 2 diabetes, hypertension, hyperlipidemia, GERD.   OT comments  Patient demonstrating good progress with OT treatment with supervision to CGA for self care tasks. Patient required cues for sequencing with bed mobility, self care, and transfers. Discharge recommendations continue to be appropriate. Acute OT to continue to follow for self care, cognition, and mobility.       If plan is discharge home, recommend the following:  A little help with walking and/or transfers;A little help with bathing/dressing/bathroom;Assistance with cooking/housework;Direct supervision/assist for medications management;Assist for transportation;Direct supervision/assist for financial management;Help with stairs or ramp for entrance;Supervision due to cognitive status   Equipment Recommendations  BSC/3in1    Recommendations for Other Services      Precautions / Restrictions Precautions Precautions: Fall Restrictions Weight Bearing Restrictions: No       Mobility Bed Mobility Overal bed mobility: Needs Assistance Bed Mobility: Supine to Sit     Supine to sit: Supervision, Used rails     General bed mobility comments: cues for rail use and sequencing    Transfers Overall transfer level: Needs assistance Equipment used: None Transfers: Sit to/from Stand Sit to Stand: Contact guard assist           General transfer comment: No AD and CGA for safety     Balance Overall balance assessment: Mild deficits observed, not formally tested                                         ADL either performed or assessed with clinical judgement   ADL Overall  ADL's : Needs assistance/impaired     Grooming: Wash/dry hands;Wash/dry face;Oral care;Brushing hair;Minimal assistance;Cueing for sequencing;Standing Grooming Details (indicate cue type and reason): assistance with toothpaste Upper Body Bathing: Contact guard assist;Cueing for sequencing;Standing   Lower Body Bathing: Contact guard assist;Cueing for sequencing;Sit to/from stand       Lower Body Dressing: Supervision/safety;Sitting/lateral leans Lower Body Dressing Details (indicate cue type and reason): increased time, seated on EOB               General ADL Comments: able to perform self care tasks while standing without a seated rest break    Extremity/Trunk Assessment              Vision       Perception     Praxis      Cognition Arousal: Alert Behavior During Therapy: Flat affect Overall Cognitive Status: Impaired/Different from baseline Area of Impairment: Orientation, Problem solving, Following commands, Attention, Memory, Safety/judgement, Awareness                 Orientation Level: Disoriented to, Place, Time, Situation Current Attention Level: Sustained Memory: Decreased recall of precautions, Decreased short-term memory Following Commands: Follows one step commands inconsistently, Follows one step commands with increased time Safety/Judgement: Decreased awareness of safety, Decreased awareness of deficits Awareness: Intellectual Problem Solving: Slow processing, Decreased initiation, Difficulty sequencing, Requires verbal cues General Comments: uable to recall orientation questions, increased time to follow directions, cues for sequencing with ADLs        Exercises      Shoulder Instructions  General Comments      Pertinent Vitals/ Pain       Pain Assessment Pain Assessment: Faces Faces Pain Scale: No hurt  Home Living                                          Prior Functioning/Environment               Frequency  Min 1X/week        Progress Toward Goals  OT Goals(current goals can now be found in the care plan section)  Progress towards OT goals: Progressing toward goals  Acute Rehab OT Goals OT Goal Formulation: Patient unable to participate in goal setting Time For Goal Achievement: 10/06/22 Potential to Achieve Goals: Good ADL Goals Pt Will Perform Grooming: standing;with supervision Pt Will Perform Lower Body Dressing: with supervision;sit to/from stand Pt Will Transfer to Toilet: with supervision;ambulating Pt Will Perform Toileting - Clothing Manipulation and hygiene: with supervision;sit to/from stand  Plan      Co-evaluation                 AM-PAC OT "6 Clicks" Daily Activity     Outcome Measure   Help from another person eating meals?: A Little Help from another person taking care of personal grooming?: A Little Help from another person toileting, which includes using toliet, bedpan, or urinal?: A Little Help from another person bathing (including washing, rinsing, drying)?: A Little Help from another person to put on and taking off regular upper body clothing?: A Little Help from another person to put on and taking off regular lower body clothing?: A Little 6 Click Score: 18    End of Session Equipment Utilized During Treatment: Gait belt  OT Visit Diagnosis: Unsteadiness on feet (R26.81);Other symptoms and signs involving cognitive function   Activity Tolerance Patient tolerated treatment well   Patient Left in chair;with call bell/phone within reach;with chair alarm set;with family/visitor present   Nurse Communication Mobility status        Time: 6578-4696 OT Time Calculation (min): 24 min  Charges: OT General Charges $OT Visit: 1 Visit OT Treatments $Self Care/Home Management : 23-37 mins  Alfonse Flavors, OTA Acute Rehabilitation Services  Office [phone]   Dewain Penning 09/24/2022, 10:27 AM

## 2022-09-24 NOTE — Care Management Important Message (Signed)
Important Message  Patient Details  Name: Justin Foster MRN: [id number] Date of Birth: [birth year]   Medicare Important Message Given:  Yes     [name] 09/24/2022, 3:02 PM

## 2022-09-24 NOTE — Progress Notes (Signed)
Physical Therapy Treatment Patient Details Name: Justin Foster MRN: [id number] DOB: 30-Dec-1945 Today's Date: 09/24/2022   History of Present Illness 77 y.o. male presented 9/3 to ED for evaluation of transient episode of aphasia. Found to have fever, Confusion/mild delirium. EEG negative. with medical history significant of type 2 diabetes, hypertension, hyperlipidemia, GERD.    PT Comments  Mobilizing a bit better today, transfers at a supervision level. Gait with CGA, no assistive device for half of 100 foot distance. No overt instability today but slower and guarded. Aware that he is in the hospital but disoriented to date, has trouble recalling his birth year, and names 2/3 children accurately. Son present and states pt walking better than the was a couple of weeks ago but otherwise his cognition is still quite a bit off from baseline (usually knows the date.) Since pt was very active with yard-work, etc. Prior to admission, and has not progressed as rapidly as we had hoped, updated rec for HHPT as a follow-up after d/c. Due to cognition pt should also have 24/7 supervision available from family or an aide; unless status improves or family cannot provide, may need to consider higher level of care after d/c - will update as appropriate and as he progresses. Patient will continue to benefit from skilled physical therapy services to further improve independence with functional mobility.     If plan is discharge home, recommend the following: Supervision due to cognitive status;Assist for transportation;Help with stairs or ramp for entrance;Assistance with cooking/housework   Can travel by private vehicle       yes  Equipment Recommendations  None recommended by PT       Precautions / Restrictions Precautions Precautions: Fall Restrictions Weight Bearing Restrictions: No     Mobility  Bed Mobility               General bed mobility comments: in recliner    Transfers Overall  transfer level: Needs assistance Equipment used: None Transfers: Sit to/from Stand Sit to Stand: Supervision           General transfer comment: supervision for safety, slow to rise and guarded. Performed from recliner.    Ambulation/Gait Ambulation/Gait assistance: Contact guard assist Gait Distance (Feet): 100 Feet Assistive device: None, IV Pole Gait Pattern/deviations: Step-through pattern, Narrow base of support Gait velocity: decr Gait velocity interpretation: <1.8 ft/sec, indicate of risk for recurrent falls   General Gait Details: CGA for safety, initially with IV for support, then progressed without AD. Narrow BOS, cues for larger step and wider foot width. Tolerated moderate dynamic gait challenges without overt LOB.   Stairs             Wheelchair Mobility     Tilt Bed    Modified Rankin (Stroke Patients Only)       Balance Overall balance assessment: Mild deficits observed, not formally tested                                          Cognition Arousal: Alert Behavior During Therapy: Flat affect Overall Cognitive Status: Impaired/Different from baseline Area of Impairment: Orientation, Problem solving, Following commands, Attention, Memory, Safety/judgement, Awareness                 Orientation Level: Disoriented to, Time, Situation Current Attention Level: Sustained Memory: Decreased recall of precautions, Decreased short-term memory Following Commands: Follows one step  commands with increased time, Follows one step commands consistently Safety/Judgement: Decreased awareness of safety, Decreased awareness of deficits Awareness: Intellectual Problem Solving: Slow processing, Decreased initiation, Difficulty sequencing, Requires verbal cues General Comments: Aware he is in a hospital. Not sure of month or year; recalls his birthdate with effort; Recalls 2/3 children's names.        Exercises General Exercises - Lower  Extremity Ankle Circles/Pumps: AROM, Both, 10 reps, Seated Quad Sets: Strengthening, Both, 10 reps, Seated    General Comments General comments (skin integrity, edema, etc.): Son in room, [place]. Supportive.      Pertinent Vitals/Pain Pain Assessment Pain Assessment: Faces Faces Pain Scale: No hurt Breathing: normal    Home Living Family/patient expects to be discharged to:: Private residence Living Arrangements: Children Available Help at Discharge: Family;Available PRN/intermittently Type of Home: House Home Access: Stairs to enter Entrance Stairs-Rails: None Entrance Stairs-Number of Steps: 2   Home Layout: One level Home Equipment: None      Prior Function            PT Goals (current goals can now be found in the care plan section) Acute Rehab PT Goals Patient Stated Goal: improve cognition, go home PT Goal Formulation: With patient/family Time For Goal Achievement: 10/05/22 Potential to Achieve Goals: Good Progress towards PT goals: Progressing toward goals    Frequency    Min 1X/week      PT Plan      Co-evaluation              AM-PAC PT "6 Clicks" Mobility   Outcome Measure  Help needed turning from your back to your side while in a flat bed without using bedrails?: A Little Help needed moving from lying on your back to sitting on the side of a flat bed without using bedrails?: A Little Help needed moving to and from a bed to a chair (including a wheelchair)?: A Little Help needed standing up from a chair using your arms (e.g., wheelchair or bedside chair)?: A Little Help needed to walk in hospital room?: A Little Help needed climbing 3-5 steps with a railing? : A Little 6 Click Score: 18    End of Session Equipment Utilized During Treatment: Gait belt Activity Tolerance: Patient tolerated treatment well Patient left: with call bell/phone within reach;with family/visitor present;in chair;with chair alarm set;with SCD's reapplied Nurse  Communication: Mobility status PT Visit Diagnosis: Unsteadiness on feet (R26.81);Other abnormalities of gait and mobility (R26.89);Other symptoms and signs involving the nervous system (R29.898)     Time: 4270-6237 PT Time Calculation (min) (ACUTE ONLY): 16 min  Charges:    $Gait Training: 8-22 mins PT General Charges $$ ACUTE PT VISIT: 1 Visit                     Kathlyn [place], PT, DPT [facility]  Rehabilitation Services Physical Therapist Office: [phone] Website: [email or web address]    Berton Mount 09/24/2022, 12:05 PM

## 2022-09-24 NOTE — TOC Transition Note (Signed)
Transition of Care [facility]) - CM/SW Discharge Note   Patient Details  Name: Justin Foster MRN: [id number] Date of Birth: 01-13-1946  Transition of Care [facility]) CM/SW Contact:  Kermit Balo, RN Phone Number: 09/24/2022, 2:30 PM   Clinical Narrative:     Pt is discharging home with home health services through [facility]. Information on the AVS. Frances Furbish will contact the patient for the first home visit. Daughter is aware of dc and will provide transportation home.  Final next level of care: Home w Home Health Services Barriers to Discharge: No Barriers Identified   Patient Goals and CMS Choice CMS Medicare.gov Compare Post Acute Care list provided to:: Patient Represenative (must comment) Choice offered to / list presented to : Adult Children  Discharge Placement                         Discharge Plan and Services Additional resources added to the After Visit Summary for     Discharge Planning Services: CM Consult                      HH Arranged: RN, PT [facility] Agency: [facility] Date [facility] Agency Contacted: 09/24/22   Representative spoke with at [facility] Agency: Kandee Keen  Social Determinants of Health (SDOH) Interventions SDOH Screenings   Food Insecurity: No Food Insecurity (09/18/2022)  Housing: Low Risk  (09/19/2022)  Transportation Needs: No Transportation Needs (09/19/2022)  Utilities: Not At Risk (09/19/2022)  Social Connections: Unknown (01/08/2022)   Received from [facility]  Tobacco Use: Low Risk  (09/18/2022)     Readmission Risk Interventions     No data to display

## 2022-09-24 NOTE — Progress Notes (Signed)
Bladder scan done, showed , 14 fr.foley catheter inserted per MD order, 450 ml output noted, patient tolerated well, will continue to monitor

## 2022-09-24 NOTE — Plan of Care (Signed)
  Problem: Education: Goal: Knowledge of General Education information will improve Description: Including pain rating scale, medication(s)/side effects and non-pharmacologic comfort measures Outcome: Progressing   Problem: Health Behavior/Discharge Planning: Goal: Ability to manage health-related needs will improve Outcome: Progressing   Problem: Clinical Measurements: Goal: Ability to maintain clinical measurements within normal limits will improve Outcome: Progressing Goal: Will remain free from infection Outcome: Progressing Goal: Diagnostic test results will improve Outcome: Progressing Goal: Cardiovascular complication will be avoided Outcome: Progressing   Problem: Activity: Goal: Risk for activity intolerance will decrease Outcome: Progressing   Problem: Nutrition: Goal: Adequate nutrition will be maintained Outcome: Progressing   Problem: Coping: Goal: Level of anxiety will decrease Outcome: Progressing   Problem: Elimination: Goal: Will not experience complications related to bowel motility Outcome: Progressing Goal: Will not experience complications related to urinary retention Outcome: Progressing   Problem: Pain Managment: Goal: General experience of comfort will improve Outcome: Progressing   Problem: Safety: Goal: Ability to remain free from injury will improve Outcome: Progressing   Problem: Skin Integrity: Goal: Risk for impaired skin integrity will decrease Outcome: Progressing   Problem: Education: Goal: Ability to describe self-care measures that may prevent or decrease complications (Diabetes Survival Skills Education) will improve Outcome: Progressing Goal: Individualized Educational Video(s) Outcome: Progressing   Problem: Coping: Goal: Ability to adjust to condition or change in health will improve Outcome: Progressing   Problem: Fluid Volume: Goal: Ability to maintain a balanced intake and output will improve Outcome: Progressing    Problem: Health Behavior/Discharge Planning: Goal: Ability to identify and utilize available resources and services will improve Outcome: Progressing Goal: Ability to manage health-related needs will improve Outcome: Progressing   Problem: Metabolic: Goal: Ability to maintain appropriate glucose levels will improve Outcome: Progressing   Problem: Nutritional: Goal: Maintenance of adequate nutrition will improve Outcome: Progressing Goal: Progress toward achieving an optimal weight will improve Outcome: Progressing   Problem: Skin Integrity: Goal: Risk for impaired skin integrity will decrease Outcome: Progressing   Problem: Tissue Perfusion: Goal: Adequacy of tissue perfusion will improve Outcome: Progressing   

## 2022-09-24 NOTE — Discharge Summary (Signed)
Physician Discharge Summary   Patient: Justin Foster MRN: [id number] DOB: [birth year]  Admit date:     09/18/2022  Discharge date: 09/24/22  Discharge Physician: Rickey Barbara   PCP: Patient, No Pcp Per   Recommendations at discharge:    Follow up with PCP in 1-2 weeks Recommend recheck BMET in 1-2 weeks Recommend patient follow up with Urology Follow up with Neurology as will be scheduled  Discharge Diagnoses: Principal Problem:   Confusion Active Problems:   Fever   Acute urinary retention   Hypokalemia   Type 2 diabetes mellitus (HCC)   Essential hypertension   Toxic metabolic encephalopathy  Resolved Problems:   * No resolved hospital problems. *  Hospital Course: 77 y.o. male with medical history significant of type 2 diabetes, hypertension, hyperlipidemia, GERD presented to ED for evaluation of transient episode of aphasia yesterday morning at around 7 AM, symptoms resolved in about 10 minutes.  Per family, LKW the night before.  Patient endorsed epigastric abdominal pain, nausea, and vomiting.  He had no acute neurologic deficits on arrival to the ED.  Temperature 100.1 F.  Blood pressure elevated in the ED with systolic above 200.  Labs showing no leukocytosis, potassium 3.3, glucose 211, no elevation of lipase or LFTs, blood ethanol level <10, UDS negative, UA not suggestive of infection, COVID/influenza/RSV PCR negative.  CT head and brain MRI negative for acute intracranial abnormality.  Chest x-ray not suggestive of pneumonia.  CT abdomen pelvis negative for acute findings.  CT did show moderate bladder distention and moderate prostatomegaly which may contribute to the bladder distention. Patient received Tylenol, Zofran, and Ativan in the ED. Neurology consulted.   Assessment and Plan: Transient aphasia Confusion/mild delirium Blood ethanol level <10, UDS negative.   -CT head and brain MRI negative for acute intracranial abnormality.  -CTA head and neck reviewed, neg,  MRI brain neg -  B12, TSH, RPR, and ammonia levels unremarkable -Pt now s/p LP with send out labs to rule out paraneoplastic disease and autoimmune encephalitis -Seen by Neuro. Suspicion for dementia with recs to f/u with Neuro as outpatient -Family reports that patient has very poor sleep habits even prior to admit.  -Much improvement after addition of at bedtime seroquel. More oriented now   Fever - resolved No leukocytosis on labs.  COVID/influenza/RSV PCR negative.   -Chest x-ray not suggestive of pneumonia.   -UA not suggestive of infection.   -f/u blood cx -Of note, pt is retaining significant amount of urine, requiring I/O cath -Remains afebrile at this time -Given trial of rocephin given jardiance PTA with bladder outlet obstruction. Repeat UA is clear   Nausea, Patient endorsed epigastric abdominal discomfort in the ED but denies at this time.   -CT abdomen pelvis unremarkable   Acute urinary retention with ARF -CT showing moderate bladder distention and moderate prostatomegaly which may contribute to the bladder distention.  -had been continued with indwelling foley, failed voiding trial -Will still recommend f/u with Urology as outpatient   Mild hypokalemia Replaced Recheck basic metabolic panel in the morning   Type 2 diabetes Cont ssi as needed -Hold oral hypoglycemic regiment while in the hospital -A1c 7.0    Hypertension Blood pressure stable currently Continue home Norvasc 10 mg p.o. daily Held lisinopril and chlorothiazide given acute renal failure   Hyperlipidemia -Will continue home simvastatin   GERD Will continue patient with Protonix        Consultants: Neurology Procedures performed: LP  Disposition: Home Diet recommendation:  Regular diet DISCHARGE MEDICATION: Allergies as of 09/24/2022   No Known Allergies      Medication List     STOP taking these medications    lisinopril-hydrochlorothiazide 20-25 MG tablet Commonly known as:  ZESTORETIC       TAKE these medications    acetaminophen 500 MG tablet Commonly known as: TYLENOL Take 1,000 mg by mouth every 6 (six) hours as needed for mild pain.   amLODipine 10 MG tablet Commonly known as: NORVASC Take 10 mg by mouth at bedtime.   aspirin EC 81 MG tablet Take 81 mg by mouth daily.   ibuprofen 200 MG tablet Commonly known as: ADVIL Take 400 mg by mouth every 6 (six) hours as needed for headache or mild pain.   Jardiance 25 MG Tabs tablet Generic drug: empagliflozin Take 25 mg by mouth daily.   metFORMIN 1000 MG tablet Commonly known as: GLUCOPHAGE Take 500 mg by mouth.   QUEtiapine 25 MG tablet Commonly known as: SEROQUEL Take 1 tablet (25 mg total) by mouth at bedtime.   ranitidine 150 MG tablet Commonly known as: ZANTAC Take 150 mg by mouth as needed for heartburn.   simvastatin 20 MG tablet Commonly known as: ZOCOR Take 5 mg by mouth at bedtime.   tamsulosin 0.4 MG Caps capsule Commonly known as: FLOMAX Take 1 capsule (0.4 mg total) by mouth daily. Start taking on: September 25, 2022        Follow-up Information     follow up with your PCP in 1-2 weeks Follow up.   Why: Hospital follow up        Despina Arias, MD Follow up.   Specialty: Urology Why: Hospital follow up Contact information: [address] [place] [place] [address] [phone]                Discharge Exam: Ceasar Mons Weights   09/18/22 9528  Weight: 64.4 kg   General exam: Awake, laying in bed, in nad Respiratory system: Normal respiratory effort, no wheezing Cardiovascular system: regular rate, s1, s2 Gastrointestinal system: Soft, nondistended, positive BS Central nervous system: CN2-12 grossly intact, strength intact Extremities: Perfused, no clubbing Skin: Normal skin turgor, no notable skin lesions seen Psychiatry: Mood normal // no visual hallucinations   Condition at discharge: fair  The results of significant diagnostics from this  hospitalization (including imaging, microbiology, ancillary and laboratory) are listed below for reference.   Imaging Studies: EEG adult  Result Date: 15-Oct-2022 Charlsie Quest, MD     10-15-22  3:08 PM Patient Name: Justin Foster MRN: [id number] Epilepsy Attending: Charlsie Quest Referring Physician/Provider: Gordy Councilman, MD Date: October 15, 2022 Duration: 22.51 mins Patient history: 77yo M with recurrent deja vu getting eeg to evaluate for seizure Level of alertness: Awake, asleep AEDs during EEG study: None Technical aspects: This EEG study was done with scalp electrodes positioned according to the 10-20 International system of electrode placement. Electrical activity was reviewed with band pass filter of 1-70Hz , sensitivity of 7 uV/mm, display speed of 50mm/sec with a 60Hz  notched filter applied as appropriate. EEG data were recorded continuously and digitally stored.  Video monitoring was available and reviewed as appropriate. Description: The posterior dominant rhythm consists of 8 Hz activity of moderate voltage (25-35 uV) seen predominantly in posterior head regions, symmetric and reactive to eye opening and eye closing. Sleep was characterized by vertex waves, sleep spindles (12 to 14 Hz), maximal frontocentral region. Physiologic photic driving was seen during photic  stimulation.  Hyperventilation was not performed.   IMPRESSION: This study is within normal limits. No seizures or epileptiform discharges were seen throughout the recording. A normal interictal EEG does not exclude the diagnosis of epilepsy. Priyanka Annabelle Harman   US RENAL  Result Date: 09/19/2022 CLINICAL DATA:  Acute renal failure EXAM: RENAL / URINARY TRACT ULTRASOUND COMPLETE COMPARISON:  CT 09/18/2022 FINDINGS: Right Kidney: Renal measurements: 9.9 x 5.3 x 4.3 cm = volume: 117 mL. Echogenicity within normal limits. No mass or hydronephrosis visualized. Left Kidney: Renal measurements: 10.1 x 4.8 x 4.3 cm = volume: 108 mL. Simple  appearing cyst in the midpole left kidney measuring 3.7 cm maximal diameter. No change since prior CT. No imaging follow-up is indicated. No hydronephrosis or solid mass. Bladder: Foley catheter in the bladder. Other: None. IMPRESSION: 1. No hydronephrosis in either kidney. Normal parenchymal echotexture. 2. Foley catheter in the bladder. Electronically Signed   By: Burman Nieves M.D.   On: 09/19/2022 21:53   CT ANGIO HEAD NECK W WO CM  Result Date: 09/19/2022 CLINICAL DATA:  Transient ischemic attack (TIA) EXAM: CT ANGIOGRAPHY HEAD AND NECK WITH AND WITHOUT CONTRAST TECHNIQUE: Multidetector CT imaging of the head and neck was performed using the standard protocol during bolus administration of intravenous contrast. Multiplanar CT image reconstructions and MIPs were obtained to evaluate the vascular anatomy. Carotid stenosis measurements (when applicable) are obtained utilizing NASCET criteria, using the distal internal carotid diameter as the denominator. RADIATION DOSE REDUCTION: This exam was performed according to the departmental dose-optimization program which includes automated exposure control, adjustment of the mA and/or kV according to patient size and/or use of iterative reconstruction technique. CONTRAST:  75mL OMNIPAQUE IOHEXOL 350 MG/ML SOLN COMPARISON:  Same day brain MR FINDINGS: CT HEAD FINDINGS Brain: No evidence of acute infarction, hemorrhage, hydrocephalus, extra-axial collection or mass lesion/mass effect. Vascular: No hyperdense vessel or unexpected calcification. Skull: Normal. Negative for fracture or focal lesion. Sinuses/Orbits: No middle ear or mastoid effusion. Mucosal thickening left maxillary sinus. Orbits are otherwise unremarkable. Other: None. Review of the MIP images confirms the above findings CTA NECK FINDINGS Aortic arch: Standard branching. Imaged portion shows no evidence of aneurysm or dissection. No significant stenosis of the major arch vessel origins. Aortic  atherosclerotic calcifications. Right carotid system: No evidence of dissection, stenosis (50% or greater), or occlusion. Left carotid system: No evidence of dissection, stenosis (50% or greater), or occlusion. Vertebral arteries: Left dominant. No evidence of dissection, stenosis (50% or greater), or occlusion. Mild narrowing of the origin of the left vertebral artery secondary to soft atherosclerotic plaque Skeleton: Negative. Other neck: Negative. Upper chest: Negative. Review of the MIP images confirms the above findings CTA HEAD FINDINGS Anterior circulation: No significant stenosis, proximal occlusion, aneurysm, or vascular malformation. Posterior circulation: No significant stenosis, proximal occlusion, aneurysm, or vascular malformation. Venous sinuses: As permitted by contrast timing, patent. Anatomic variants: None Review of the MIP images confirms the above findings IMPRESSION: 1. No acute intracranial abnormality. 2. No intracranial large vessel occlusion. 3. No hemodynamically significant stenosis in the neck. Aortic Atherosclerosis (ICD10-I70.0). Electronically Signed   By: Lorenza Cambridge M.D.   On: 09/19/2022 15:03   EEG adult  Result Date: 09/19/2022 Charlsie Quest, MD     09/19/2022  9:05 AM Patient Name: KIWAN MULVANEY MRN: [id number] Epilepsy Attending: Charlsie Quest Referring Physician/Provider: Rejeana Brock, MD Date: 09/19/2022 Duration: 25.58 mins Patient history: 77 year old male with waxing and waning speech difficulty in the setting of  low-grade fever.  EEG to evaluate for seizure Level of alertness: Awake AEDs during EEG study: Ativan Technical aspects: This EEG study was done with scalp electrodes positioned according to the 10-20 International system of electrode placement. Electrical activity was reviewed with band pass filter of 1-70Hz , sensitivity of 7 uV/mm, display speed of 43mm/sec with a 60Hz  notched filter applied as appropriate. EEG data were recorded continuously  and digitally stored.  Video monitoring was available and reviewed as appropriate. Description: The posterior dominant rhythm consists of 8 Hz activity of moderate voltage (25-35 uV) seen predominantly in posterior head regions, symmetric and reactive to eye opening and eye closing. Physiologic photic driving was not seen during photic stimulation.  Hyperventilation was not performed.   IMPRESSION: This study is within normal limits. No seizures or epileptiform discharges were seen throughout the recording. A normal interictal EEG does not exclude the diagnosis of epilepsy. Charlsie Quest   MR BRAIN WO CONTRAST  Result Date: 09/19/2022 CLINICAL DATA:  Initial evaluation for TIA. EXAM: MRI HEAD WITHOUT CONTRAST TECHNIQUE: Multiplanar, multiecho pulse sequences of the brain and surrounding structures were obtained without intravenous contrast. COMPARISON:  CT from 09/18/2022. FINDINGS: Brain: Cerebral volume within normal limits for age. Scattered patchy T2/FLAIR hyperintensity involving the supratentorial cerebral white matter, most likely related chronic microvascular ischemic disease, mild in nature. No evidence for acute or subacute ischemia. Gray-white matter differentiation maintained. No areas of chronic cortical infarction. No acute or chronic intracranial blood products. No mass lesion, midline shift or mass effect. No hydrocephalus or extra-axial fluid collection. Pituitary gland suprasellar region within normal limits. Vascular: Major intracranial vascular flow voids are maintained. Skull and upper cervical spine: Cranial junction within normal limits. Bone marrow signal intensity normal. No scalp soft tissue abnormality. Sinuses/Orbits: Globes and orbital soft tissues within normal limits. Few small retention cyst noted within the paranasal sinuses. Paranasal sinuses are otherwise clear. No mastoid effusion. Other: None. IMPRESSION: 1. No acute intracranial abnormality. 2. Mild chronic microvascular  ischemic disease for age. Electronically Signed   By: Rise Mu M.D.   On: 09/19/2022 03:46   CT ABDOMEN PELVIS W CONTRAST  Result Date: 09/18/2022 CLINICAL DATA:  Bowel obstruction suspected. Increasing confusion over the last several months with epigastric pain. EXAM: CT ABDOMEN AND PELVIS WITH CONTRAST TECHNIQUE: Multidetector CT imaging of the abdomen and pelvis was performed using the standard protocol following bolus administration of intravenous contrast. RADIATION DOSE REDUCTION: This exam was performed according to the departmental dose-optimization program which includes automated exposure control, adjustment of the mA and/or kV according to patient size and/or use of iterative reconstruction technique. CONTRAST:  80mL OMNIPAQUE IOHEXOL 350 MG/ML SOLN COMPARISON:  Abdominopelvic CT 07/04/2016. FINDINGS: Lower chest: Clear lung bases. No significant pleural or pericardial effusion. Hepatobiliary: The liver is normal in density without suspicious focal abnormality. There are several small calcified gallstones. No evidence of gallbladder wall thickening, surrounding inflammation or biliary ductal dilatation. Pancreas: Unremarkable. No pancreatic ductal dilatation or surrounding inflammatory changes. Spleen: Normal in size without focal abnormality. Adrenals/Urinary Tract: Both adrenal glands appear normal. No evidence of urinary tract calculus, suspicious renal lesion or hydronephrosis. There are similar benign-appearing renal cysts bilaterally for which no specific follow-up imaging is recommended. The urinary bladder is moderately distended with possible mild perivesical soft tissue stranding. No bladder wall thickening or other focal abnormality identified. Stomach/Bowel: No enteric contrast administered. The stomach appears unremarkable for its degree of distension. No evidence of bowel wall thickening, distention or surrounding inflammatory change.  The appendix appears normal.  Vascular/Lymphatic: There are no enlarged abdominal or pelvic lymph nodes. Aortic and branch vessel atherosclerosis without evidence of aneurysm or large vessel occlusion. Reproductive: The prostate gland is moderately enlarged. Other: No evidence of abdominal wall mass or hernia. No ascites or pneumoperitoneum. Musculoskeletal: No acute or significant osseous findings. Mild spondylosis. Unless specific follow-up recommendations are mentioned in the findings or impression sections, no imaging follow-up of any mentioned incidental findings is recommended. IMPRESSION: 1. No evidence of bowel obstruction or other acute abdominopelvic findings. 2. Cholelithiasis without evidence of cholecystitis or biliary ductal dilatation. 3. Moderate bladder distension with possible mild perivesical soft tissue stranding. Correlate with urinalysis to exclude cystitis. 4. Moderate prostatomegaly, as before, which may contribute to the bladder distension on the basis of outlet obstruction. Correlate clinically. 5.  Aortic Atherosclerosis (ICD10-I70.0). Electronically Signed   By: Carey Bullocks M.D.   On: 09/18/2022 18:37   CT HEAD WO CONTRAST  Result Date: 09/18/2022 CLINICAL DATA:  Worsening confusion.  Altered mental status.  TIA. EXAM: CT HEAD WITHOUT CONTRAST TECHNIQUE: Contiguous axial images were obtained from the base of the skull through the vertex without intravenous contrast. RADIATION DOSE REDUCTION: This exam was performed according to the departmental dose-optimization program which includes automated exposure control, adjustment of the mA and/or kV according to patient size and/or use of iterative reconstruction technique. COMPARISON:  None Available. FINDINGS: Brain: No evidence of intracranial hemorrhage, acute infarction, hydrocephalus, extra-axial collection, or mass lesion/mass effect. Vascular:  No hyperdense vessel or other acute findings. Skull: No evidence of fracture or other significant bone abnormality.  Sinuses/Orbits:  No acute findings. Other: None. IMPRESSION: Negative noncontrast head CT. Electronically Signed   By: Danae Orleans M.D.   On: 09/18/2022 13:00   DG Chest 2 View  Result Date: 09/18/2022 CLINICAL DATA:  Altered mental status EXAM: CHEST - 2 VIEW COMPARISON:  X-ray 07/04/2016 FINDINGS: No consolidation, pneumothorax or effusion. No edema. Normal cardiopericardial silhouette. Overlapping cardiac leads. Degenerative changes are seen along the spine. IMPRESSION: No acute cardiopulmonary disease. Electronically Signed   By: Karen Kays M.D.   On: 09/18/2022 12:35    Microbiology: Results for orders placed or performed during the hospital encounter of 09/18/22  Resp panel by RT-PCR (RSV, Flu A&B, Covid) Anterior Nasal Swab     Status: None   Collection Time: 09/18/22  4:14 PM   Specimen: Anterior Nasal Swab  Result Value Ref Range Status   SARS Coronavirus 2 by RT PCR NEGATIVE NEGATIVE Final    Comment: (NOTE) SARS-CoV-2 target nucleic acids are NOT DETECTED.  The SARS-CoV-2 RNA is generally detectable in upper respiratory specimens during the acute phase of infection. The lowest concentration of SARS-CoV-2 viral copies this assay can detect is 138 copies/mL. A negative result does not preclude SARS-Cov-2 infection and should not be used as the sole basis for treatment or other patient management decisions. A negative result may occur with  improper specimen collection/handling, submission of specimen other than nasopharyngeal swab, presence of viral mutation(s) within the areas targeted by this assay, and inadequate number of viral copies(<138 copies/mL). A negative result must be combined with clinical observations, patient history, and epidemiological information. The expected result is Negative.  Fact Sheet for Patients:  BloggerCourse.com  Fact Sheet for Healthcare Providers:  SeriousBroker.it  This test is no t yet  approved or cleared by the [place] FDA and  has been authorized for detection and/or diagnosis of SARS-CoV-2 by FDA under an Emergency Use  Authorization (EUA). This EUA will remain  in effect (meaning this test can be used) for the duration of the COVID-19 declaration under Section 564(b)(1) of the Act, 21 U.S.C.section 360bbb-3(b)(1), unless the authorization is terminated  or revoked sooner.       Influenza A by PCR NEGATIVE NEGATIVE Final   Influenza B by PCR NEGATIVE NEGATIVE Final    Comment: (NOTE) The Xpert Xpress SARS-CoV-2/FLU/RSV plus assay is intended as an aid in the diagnosis of influenza from Nasopharyngeal swab specimens and should not be used as a sole basis for treatment. Nasal washings and aspirates are unacceptable for Xpert Xpress SARS-CoV-2/FLU/RSV testing.  Fact Sheet for Patients: BloggerCourse.com  Fact Sheet for Healthcare Providers: SeriousBroker.it  This test is not yet approved or cleared by the [place] FDA and has been authorized for detection and/or diagnosis of SARS-CoV-2 by FDA under an Emergency Use Authorization (EUA). This EUA will remain in effect (meaning this test can be used) for the duration of the COVID-19 declaration under Section 564(b)(1) of the Act, 21 U.S.C. section 360bbb-3(b)(1), unless the authorization is terminated or revoked.     Resp Syncytial Virus by PCR NEGATIVE NEGATIVE Final    Comment: (NOTE) Fact Sheet for Patients: BloggerCourse.com  Fact Sheet for Healthcare Providers: SeriousBroker.it  This test is not yet approved or cleared by the [place] FDA and has been authorized for detection and/or diagnosis of SARS-CoV-2 by FDA under an Emergency Use Authorization (EUA). This EUA will remain in effect (meaning this test can be used) for the duration of the COVID-19 declaration under Section 564(b)(1) of  the Act, 21 U.S.C. section 360bbb-3(b)(1), unless the authorization is terminated or revoked.  Performed at [facility], [address] [place], [place] [address]   Culture, blood (Routine X 2) w Reflex to ID Panel     Status: None   Collection Time: 09/19/22 11:15 AM   Specimen: BLOOD LEFT ARM  Result Value Ref Range Status   Specimen Description BLOOD LEFT ARM  Final   Special Requests   Final    BOTTLES DRAWN AEROBIC ONLY Blood Culture adequate volume   Culture   Final    NO GROWTH 5 DAYS Performed at [facility], [address], [place], [place] [address]    Report Status 09/24/2022 FINAL  Final  Culture, blood (Routine X 2) w Reflex to ID Panel     Status: None   Collection Time: 09/19/22 11:15 AM   Specimen: BLOOD LEFT ARM  Result Value Ref Range Status   Specimen Description BLOOD LEFT ARM  Final   Special Requests   Final    BOTTLES DRAWN AEROBIC AND ANAEROBIC AEROBIC BOTTLE ONLY   Culture   Final    NO GROWTH 5 DAYS Performed at [facility], [address] [place], [place] [address]    Report Status 09/24/2022 FINAL  Final  CSF culture w Gram Stain     Status: None   Collection Time: 09/20/22  9:41 AM   Specimen: CSF; Cerebrospinal Fluid  Result Value Ref Range Status   Specimen Description CSF  Final   Special Requests NONE  Final   Gram Stain   Final    WBC PRESENT, PREDOMINANTLY MONONUCLEAR NO ORGANISMS SEEN CYTOSPIN SMEAR    Culture   Final    NO GROWTH 3 DAYS Performed at [facility], [address] [place], [place] [address]    Report Status 09/23/2022 FINAL  Final  Labs: CBC: Recent Labs  Lab 09/18/22 1024 09/20/22 0857 09/21/22 0637 09/22/22 1220  WBC 7.1 13.5* 10.6* 9.0  HGB 13.0 12.6* 12.2* 12.4*  HCT 38.8* 38.8* 36.9* 38.6*  MCV 83.6 84.5 85.8 84.6  PLT 262 205 173 187   Basic Metabolic Panel: Recent Labs  Lab 09/18/22 1024 09/19/22 1115 09/20/22 0857 09/21/22 0637 09/22/22 1220   NA 135 136 137 138 136  K 3.3* 3.3* 3.3* 3.3* 4.0  CL 99 102 103 102 103  CO2 29 25 27 26 25   GLUCOSE 211* 212* 141* 149* 286*  BUN 14 24* 28* 20 20  CREATININE 0.83 1.59* 1.17 1.04 1.09  CALCIUM 9.0 8.8* 9.1 9.1 9.1  MG  --  1.8  --   --   --    Liver Function Tests: Recent Labs  Lab 09/18/22 1024 09/20/22 0857 09/21/22 0637 09/22/22 1220  AST 12* 14* 12* 12*  ALT 13 21 16 16   ALKPHOS 52 55 50 55  BILITOT 0.6 1.1 1.1 0.8  PROT 7.2 6.3* 6.0* 6.7  ALBUMIN 4.0 3.1* 2.9* 3.1*   CBG: Recent Labs  Lab 09/23/22 1203 09/23/22 1609 09/23/22 2105 09/24/22 0605 09/24/22 1241  GLUCAP 219* 232* 239* 165* 225*    Discharge time spent: less than 30 minutes.  Signed: Rickey Barbara, MD [facility] 09/24/2022

## 2022-09-24 NOTE — Progress Notes (Signed)
Patient discharged with foley catheter on, AVS instruction given to daughter, she verbalized understanding, dropped to main entrance A to daughter's car.

## 2022-09-25 LAB — METHYLMALONIC ACID, SERUM: Methylmalonic Acid, Quantitative: 206 nmol/L (ref 0–378)

## 2022-09-29 ENCOUNTER — Ambulatory Visit
Admission: EM | Admit: 2022-09-29 | Discharge: 2022-09-29 | Disposition: A | Discharge status: Home / Self Care | Payer: Medicare HMO | Attending: Nurse Practitioner | Admitting: Nurse Practitioner

## 2022-09-29 ENCOUNTER — Encounter: Payer: Self-pay | Admitting: Emergency Medicine

## 2022-09-29 DIAGNOSIS — J069 Acute upper respiratory infection, unspecified: Secondary | ICD-10-CM | POA: Diagnosis present

## 2022-09-29 DIAGNOSIS — Z20822 Contact with and (suspected) exposure to covid-19: Secondary | ICD-10-CM | POA: Insufficient documentation

## 2022-09-29 DIAGNOSIS — Z1152 Encounter for screening for COVID-19: Secondary | ICD-10-CM | POA: Insufficient documentation

## 2022-09-29 NOTE — ED Triage Notes (Signed)
Nasal congestion, itchy throat yesterday.

## 2022-09-29 NOTE — ED Provider Notes (Signed)
[facility]    CSN: [id number] Arrival date & time: 09/29/22  1107      History   Chief Complaint No chief complaint on file.   HPI Justin Foster is a 77 y.o. male.   The history is provided by the patient and a relative (Daughter).   Patient presents with his daughter for complaints of nasal congestion and scratchy throat.  Patient's daughter states symptoms started over the past 24 hours.  Patient's daughter states that she and her spouse have tested positive for COVID and the patient lives with them.  Patient denies fever, chills, headache, ear pain, ear drainage, cough, abdominal pain, nausea, vomiting, or diarrhea.  Patient reports he has been using an over-the-counter nasal spray for his symptoms with good relief.    Past Medical History:  Diagnosis Date   Diabetes mellitus without complication (HCC)    Hypertension     Patient Active Problem List   Diagnosis Date Noted   Toxic metabolic encephalopathy 09/20/2022   Confusion 09/19/2022   Fever 09/19/2022   Acute urinary retention 09/19/2022   Hypokalemia 09/19/2022   Type 2 diabetes mellitus (HCC) 09/19/2022   Essential hypertension 09/19/2022    History reviewed. No pertinent surgical history.     Home Medications    Prior to Admission medications   Medication Sig Start Date End Date Taking? Authorizing Provider  acetaminophen (TYLENOL) 500 MG tablet Take 1,000 mg by mouth every 6 (six) hours as needed for mild pain.    [provider]  amLODipine (NORVASC) 10 MG tablet Take 10 mg by mouth at bedtime. 02/09/16   [provider]  aspirin EC 81 MG tablet Take 81 mg by mouth daily. 07/08/15   [provider]  empagliflozin (JARDIANCE) 25 MG TABS tablet Take 25 mg by mouth daily.    [provider]  ibuprofen (ADVIL,MOTRIN) 200 MG tablet Take 400 mg by mouth every 6 (six) hours as needed for headache or mild pain.    [provider]  metFORMIN (GLUCOPHAGE)  1000 MG tablet Take 500 mg by mouth. 02/09/16   [provider]  QUEtiapine (SEROQUEL) 25 MG tablet Take 1 tablet (25 mg total) by mouth at bedtime. 09/24/22 10/24/22  Jerald Kief, MD  ranitidine (ZANTAC) 150 MG tablet Take 150 mg by mouth as needed for heartburn.    [provider]  simvastatin (ZOCOR) 20 MG tablet Take 5 mg by mouth at bedtime. 05/10/16   [provider]  tamsulosin (FLOMAX) 0.4 MG CAPS capsule Take 1 capsule (0.4 mg total) by mouth daily. 09/25/22 10/25/22  Jerald Kief, MD    Family History History reviewed. No pertinent family history.  Social History Social History   Tobacco Use   Smoking status: Never   Smokeless tobacco: Never  Substance Use Topics   Alcohol use: No     Allergies   Patient has no known allergies.   Review of Systems Review of Systems Per HPI  Physical Exam Triage Vital Signs ED Triage Vitals  Encounter Vitals Group     BP 09/29/22 1142 130/76     Systolic BP Percentile --      Diastolic BP Percentile --      Pulse Rate 09/29/22 1142 71     Resp 09/29/22 1142 16     Temp 09/29/22 1142 98.6 F (37 C)     Temp Source 09/29/22 1142 Oral     SpO2 09/29/22 1142 95 %  Weight --      Height --      Head Circumference --      Peak Flow --      Pain Score 09/29/22 1143 0     Pain Loc --      Pain Education --      Exclude from Growth Chart --    No data found.  Updated Vital Signs BP 130/76 (BP Location: Right Arm)   Pulse 71   Temp 98.6 F (37 C) (Oral)   Resp 16   SpO2 95%   Visual Acuity Right Eye Distance:   Left Eye Distance:   Bilateral Distance:    Right Eye Near:   Left Eye Near:    Bilateral Near:     Physical Exam Vitals and nursing note reviewed.  Constitutional:      General: He is not in acute distress.    Appearance: Normal appearance.  HENT:     Head: Normocephalic.     Right Ear: Tympanic membrane, ear canal and external ear normal.     Left Ear: Tympanic  membrane, ear canal and external ear normal.     Nose: Congestion and rhinorrhea present. Rhinorrhea is clear.     Right Turbinates: Enlarged and swollen.     Left Turbinates: Enlarged and swollen.     Right Sinus: No maxillary sinus tenderness or frontal sinus tenderness.     Left Sinus: No maxillary sinus tenderness or frontal sinus tenderness.     Mouth/Throat:     Lips: Pink.     Mouth: Mucous membranes are moist.     Pharynx: Posterior oropharyngeal erythema and postnasal drip present. No pharyngeal swelling, oropharyngeal exudate or uvula swelling.  Eyes:     Extraocular Movements: Extraocular movements intact.     Conjunctiva/sclera: Conjunctivae normal.     Pupils: Pupils are equal, round, and reactive to light.  Cardiovascular:     Rate and Rhythm: Normal rate and regular rhythm.     Pulses: Normal pulses.     Heart sounds: Normal heart sounds.  Pulmonary:     Effort: Pulmonary effort is normal. No respiratory distress.     Breath sounds: Normal breath sounds. No stridor. No wheezing, rhonchi or rales.  Abdominal:     General: Bowel sounds are normal.     Palpations: Abdomen is soft.     Tenderness: There is no abdominal tenderness.  Musculoskeletal:     Cervical back: Normal range of motion.  Lymphadenopathy:     Cervical: No cervical adenopathy.  Skin:    General: Skin is warm and dry.  Neurological:     General: No focal deficit present.     Mental Status: He is alert and oriented to person, place, and time.  Psychiatric:        Mood and Affect: Mood normal.        Behavior: Behavior normal.      UC Treatments / Results  Labs (all labs ordered are listed, but only abnormal results are displayed) Labs Reviewed  SARS CORONAVIRUS 2 (TAT 6-24 HRS)    EKG   Radiology No results found.  Procedures Procedures (including critical care time)  Medications Ordered in UC Medications - No data to display  Initial Impression / Assessment and Plan / UC Course   I have reviewed the triage vital signs and the nursing notes.  Pertinent labs & imaging results that were available during my care of the patient were reviewed by me and  considered in my medical decision making (see chart for details).  The patient is well-appearing, he is in no acute distress, vital signs are stable.  COVID test is pending.  Given his close exposure, symptoms most likely are caused by COVID.  Patient is able to receive Paxlovid (renal dosing) if the COVID test is positive.  He will also need to hold his Zocor for 2 weeks.  Supportive care recommendations were provided and discussed with the patient and his daughter to include continuing the over-the-counter nasal spray he is currently using, over-the-counter Tylenol for pain or discomfort, warm salt water gargles, and normal saline nasal spray.  Patient's daughter was given strict ER follow-up precautions.  She was also advised to follow-up with the patient's primary care physician to notify her of the recent COVID diagnosis.  Patient and daughter are in agreement with this plan of care and verbalized understanding.  All questions were answered.  Patient stable for discharge.  Final Clinical Impressions(s) / UC Diagnoses   Final diagnoses:  Encounter for screening for COVID-19  Viral upper respiratory infection  Close exposure to COVID-19 virus     Discharge Instructions      COVID test is pending.  You will be contacted if the pending test result is positive.  You will also have access to your results via MyChart. If you begin the medication for COVID, you will need to hold your simvastatin (Zocor) for 2 weeks. Continue nasal spray you are currently using for nasal congestion and runny nose. May take over-the-counter Tylenol as needed for pain, fever, general discomfort. Increase fluids and allow for plenty of rest. Warm salt water gargles 3-4 times daily as needed for throat pain or discomfort. Recommend normal saline  nasal spray throughout the day to help with nasal congestion and runny nose. If you develop a cough, recommend using a humidifier in your bedroom at nighttime during sleep and sleeping elevated on pillows while cough symptoms persist. If your COVID test is positive and you have a fever, you will need to remain home until you have been fever free for 24 hours with no medication.  You are able to resume your normal activities if you have symptoms as long as you are wearing a mask. As discussed, you will need to remain home if you develop a fever.  You can return to your normal activities when she has been fever free for 24 hours with no medication.  If you have symptoms, you can return to your normal activities as long as you are wearing a mask.  If you continue to experience symptoms after completing the medication, continue to wear your mask for an additional 5 days. Follow-up in the emergency department immediately if you experience fevers, shortness of breath, difficulty breathing, or other concerns. Please notify your primary care physician of your recent positive COVID test. Follow-up as needed.      ED Prescriptions   None    PDMP not reviewed this encounter.   Abran Cantor, NP 09/29/22 1233

## 2022-09-29 NOTE — Discharge Instructions (Addendum)
COVID test is pending.  You will be contacted if the pending test result is positive.  You will also have access to your results via MyChart. If you begin the medication for COVID, you will need to hold your simvastatin (Zocor) for 2 weeks. Continue nasal spray you are currently using for nasal congestion and runny nose. May take over-the-counter Tylenol as needed for pain, fever, general discomfort. Increase fluids and allow for plenty of rest. Warm salt water gargles 3-4 times daily as needed for throat pain or discomfort. Recommend normal saline nasal spray throughout the day to help with nasal congestion and runny nose. If you develop a cough, recommend using a humidifier in your bedroom at nighttime during sleep and sleeping elevated on pillows while cough symptoms persist. If your COVID test is positive and you have a fever, you will need to remain home until you have been fever free for 24 hours with no medication.  You are able to resume your normal activities if you have symptoms as long as you are wearing a mask. As discussed, you will need to remain home if you develop a fever.  You can return to your normal activities when she has been fever free for 24 hours with no medication.  If you have symptoms, you can return to your normal activities as long as you are wearing a mask.  If you continue to experience symptoms after completing the medication, continue to wear your mask for an additional 5 days. Follow-up in the emergency department immediately if you experience fevers, shortness of breath, difficulty breathing, or other concerns. Please notify your primary care physician of your recent positive COVID test. Follow-up as needed.

## 2022-09-30 ENCOUNTER — Telehealth: Payer: Self-pay | Admitting: Emergency Medicine

## 2022-09-30 LAB — SARS CORONAVIRUS 2 (TAT 6-24 HRS): SARS Coronavirus 2: POSITIVE — AB

## 2022-09-30 MED ORDER — PAXLOVID (150/100) 10 X 150 MG & 10 X 100MG PO TBPK: 2.0000 | ORAL_TABLET | Freq: Two times a day (BID) | ORAL | 0 refills | Status: AC

## 2022-09-30 NOTE — Telephone Encounter (Signed)
Patient positive for covid.  Paxlovid renal dosing sent in to [facility] for patient.  Patient informed of positive result.

## 2022-10-01 ENCOUNTER — Other Ambulatory Visit: Payer: Self-pay

## 2022-10-01 ENCOUNTER — Encounter (HOSPITAL_BASED_OUTPATIENT_CLINIC_OR_DEPARTMENT_OTHER): Payer: Self-pay | Admitting: Emergency Medicine

## 2022-10-01 ENCOUNTER — Emergency Department (HOSPITAL_BASED_OUTPATIENT_CLINIC_OR_DEPARTMENT_OTHER)
Admission: EM | Admit: 2022-10-01 | Discharge: 2022-10-01 | Disposition: A | Discharge status: Home / Self Care | Payer: Medicare HMO | Attending: Emergency Medicine | Admitting: Emergency Medicine

## 2022-10-01 DIAGNOSIS — I10 Essential (primary) hypertension: Secondary | ICD-10-CM | POA: Insufficient documentation

## 2022-10-01 DIAGNOSIS — Z79899 Other long term (current) drug therapy: Secondary | ICD-10-CM | POA: Insufficient documentation

## 2022-10-01 DIAGNOSIS — Y732 Prosthetic and other implants, materials and accessory gastroenterology and urology devices associated with adverse incidents: Secondary | ICD-10-CM | POA: Insufficient documentation

## 2022-10-01 DIAGNOSIS — Z7982 Long term (current) use of aspirin: Secondary | ICD-10-CM | POA: Diagnosis not present

## 2022-10-01 DIAGNOSIS — E119 Type 2 diabetes mellitus without complications: Secondary | ICD-10-CM | POA: Insufficient documentation

## 2022-10-01 DIAGNOSIS — T839XXA Unspecified complication of genitourinary prosthetic device, implant and graft, initial encounter: Secondary | ICD-10-CM | POA: Diagnosis present

## 2022-10-01 DIAGNOSIS — Z7984 Long term (current) use of oral hypoglycemic drugs: Secondary | ICD-10-CM | POA: Insufficient documentation

## 2022-10-01 NOTE — ED Notes (Signed)
Pt unable to provide urine sample so he was given a cup of water per his request to facilitate providing a sample

## 2022-10-01 NOTE — Discharge Instructions (Signed)
If he has more difficulty urinating or more pain in his abdomen return for catheter placement.

## 2022-10-01 NOTE — ED Provider Notes (Signed)
[place] EMERGENCY DEPARTMENT AT [facility] Provider Note   CSN: [id number] Arrival date & time: 10/01/22  0805     History  Chief Complaint  Patient presents with   Pulled out urinary catheter    Justin Foster is a 77 y.o. male.  HPI Patient with Foley catheter that came out sometimes during the night.  Does have some baseline confusion.  Around 2 weeks ago catheter was placed for urinary retention.  Came out this morning.  Due to follow-up in about 9 days with urology.  Since catheter has come out he has urinated on his own.  Without complaints at this time.   Past Medical History:  Diagnosis Date   Diabetes mellitus without complication (HCC)    Hypertension     Home Medications Prior to Admission medications   Medication Sig Start Date End Date Taking? Authorizing Provider  acetaminophen (TYLENOL) 500 MG tablet Take 1,000 mg by mouth every 6 (six) hours as needed for mild pain.    [provider]  amLODipine (NORVASC) 10 MG tablet Take 10 mg by mouth at bedtime. 02/09/16   [provider]  aspirin EC 81 MG tablet Take 81 mg by mouth daily. 07/08/15   [provider]  empagliflozin (JARDIANCE) 25 MG TABS tablet Take 25 mg by mouth daily.    [provider]  ibuprofen (ADVIL,MOTRIN) 200 MG tablet Take 400 mg by mouth every 6 (six) hours as needed for headache or mild pain.    [provider]  metFORMIN (GLUCOPHAGE) 1000 MG tablet Take 500 mg by mouth. 02/09/16   [provider]  nirmatrelvir & ritonavir (PAXLOVID, 150/100,) 10 x 150 MG & 10 x 100MG  TBPK Take 2 tablets by mouth 2 (two) times daily for 5 days. 09/30/22 10/05/22  Leath-Warren, Sadie Haber, NP  QUEtiapine (SEROQUEL) 25 MG tablet Take 1 tablet (25 mg total) by mouth at bedtime. 09/24/22 10/24/22  Jerald Kief, MD  ranitidine (ZANTAC) 150 MG tablet Take 150 mg by mouth as needed for heartburn.    [provider]  simvastatin (ZOCOR) 20 MG tablet  Take 5 mg by mouth at bedtime. 05/10/16   [provider]  tamsulosin (FLOMAX) 0.4 MG CAPS capsule Take 1 capsule (0.4 mg total) by mouth daily. 09/25/22 10/25/22  Jerald Kief, MD      Allergies    Patient has no known allergies.    Review of Systems   Review of Systems  Physical Exam Updated Vital Signs BP (!) 144/71 (BP Location: Right Arm)   Pulse 77   Temp 98.6 F (37 C) (Oral)   Resp 16   Ht 5\' 9"  (1.753 m)   Wt 62.3 kg   SpO2 100%   BMI 20.29 kg/m  Physical Exam Vitals reviewed.  Cardiovascular:     Rate and Rhythm: Normal rate.  Abdominal:     Tenderness: There is no abdominal tenderness.  Genitourinary:    Comments: Mild irritation at tip of penis.  No blood. Neurological:     Mental Status: He is alert. Mental status is at baseline.     ED Results / Procedures / Treatments   Labs (all labs ordered are listed, but only abnormal results are displayed) Labs Reviewed - No data to display  EKG None  Radiology No results found.  Procedures Procedures    Medications Ordered in ED Medications - No data to display  ED Course/ Medical Decision Making/ A&P  Medical Decision Making  Patient with Foley catheter that was pulled out.  No bleeding.  Has urinated since.  Has been in for a couple weeks now.  Will give a voiding trial here and if good postvoid residual discharge home without catheter.  Patient able to urinate.  Had postvoid residual of 233.  Discussed with Dr. Annabell Howells from urology.  Will leave out catheter now and have follow-up on the 25th as planned.  Will return if cannot urinate.        Final Clinical Impression(s) / ED Diagnoses Final diagnoses:  Foley catheter problem, initial encounter [facility])    Rx / DC Orders ED Discharge Orders     None         Benjiman Core, MD 10/01/22 1050

## 2022-10-01 NOTE — ED Triage Notes (Signed)
Pt alert, ambulatory, NAD with daughter. Per pt's daughter pt has confusion at baseline and that this morning she saw his urinary catheter on the floor so pt pulled it out sometime throughout the night. Pt denies pain at present. No signs of bleeding per daughter. Catheter was placed approx 1.5 wk ago.

## 2022-10-01 NOTE — ED Notes (Signed)
 bladder scan volume

## 2022-10-18 ENCOUNTER — Telehealth: Payer: Medicare HMO

## 2022-10-18 NOTE — Telephone Encounter (Signed)
Barry Dienes from [facility] called to see if patient is a patient at [facility] for orders.

## 2022-12-04 ENCOUNTER — Telehealth: Payer: Self-pay | Admitting: Neurology

## 2022-12-04 ENCOUNTER — Encounter: Payer: Self-pay | Admitting: Neurology

## 2022-12-04 NOTE — Telephone Encounter (Signed)
LVM and sent letter in mail informing pt of need to reschedule 01/03/23 appt - office closing early

## 2023-01-03 ENCOUNTER — Ambulatory Visit: Payer: Medicare HMO | Admitting: Neurology

## 2023-03-23 ENCOUNTER — Emergency Department (HOSPITAL_BASED_OUTPATIENT_CLINIC_OR_DEPARTMENT_OTHER)
Admission: EM | Admit: 2023-03-23 | Discharge: 2023-03-24 | Disposition: A | Discharge status: Home / Self Care | Attending: Emergency Medicine | Admitting: Emergency Medicine

## 2023-03-23 ENCOUNTER — Encounter (HOSPITAL_BASED_OUTPATIENT_CLINIC_OR_DEPARTMENT_OTHER): Payer: Self-pay | Admitting: *Deleted

## 2023-03-23 ENCOUNTER — Other Ambulatory Visit: Payer: Self-pay

## 2023-03-23 ENCOUNTER — Emergency Department (HOSPITAL_BASED_OUTPATIENT_CLINIC_OR_DEPARTMENT_OTHER)

## 2023-03-23 DIAGNOSIS — R339 Retention of urine, unspecified: Secondary | ICD-10-CM | POA: Diagnosis not present

## 2023-03-23 DIAGNOSIS — Z7982 Long term (current) use of aspirin: Secondary | ICD-10-CM | POA: Diagnosis not present

## 2023-03-23 DIAGNOSIS — N3001 Acute cystitis with hematuria: Secondary | ICD-10-CM | POA: Insufficient documentation

## 2023-03-23 DIAGNOSIS — R300 Dysuria: Secondary | ICD-10-CM | POA: Diagnosis present

## 2023-03-23 DIAGNOSIS — F0390 Unspecified dementia without behavioral disturbance: Secondary | ICD-10-CM | POA: Diagnosis not present

## 2023-03-23 HISTORY — DX: Unspecified dementia, unspecified severity, without behavioral disturbance, psychotic disturbance, mood disturbance, and anxiety: F03.90

## 2023-03-23 LAB — CBC
HCT: 46.5 % (ref 39.0–52.0)
Hemoglobin: 15.6 g/dL (ref 13.0–17.0)
MCH: 27.5 pg (ref 26.0–34.0)
MCHC: 33.5 g/dL (ref 30.0–36.0)
MCV: 82 fL (ref 80.0–100.0)
Platelets: 348 10*3/uL (ref 150–400)
RBC: 5.67 MIL/uL (ref 4.22–5.81)
RDW: 13.6 % (ref 11.5–15.5)
WBC: 15 10*3/uL — ABNORMAL HIGH (ref 4.0–10.5)
nRBC: 0 % (ref 0.0–0.2)

## 2023-03-23 LAB — URINALYSIS, ROUTINE W REFLEX MICROSCOPIC
Bacteria, UA: NONE SEEN
Bilirubin Urine: NEGATIVE
Glucose, UA: >1000 mg/dL — AB
Ketones, ur: 15 mg/dL — AB
Nitrite: NEGATIVE
Protein, ur: 30 mg/dL — AB
Specific Gravity, Urine: 1.025 (ref 1.005–1.030)
WBC, UA: >50 WBC/hpf (ref 0–5)
pH: 5.5 (ref 5.0–8.0)

## 2023-03-23 LAB — BASIC METABOLIC PANEL
Anion gap: 14 (ref 5–15)
BUN: 20 mg/dL (ref 8–23)
CO2: 26 mmol/L (ref 22–32)
Calcium: 9.8 mg/dL (ref 8.9–10.3)
Chloride: 95 mmol/L — ABNORMAL LOW (ref 98–111)
Creatinine, Ser: 0.97 mg/dL (ref 0.61–1.24)
GFR, Estimated: >60 mL/min (ref >60)
Glucose, Bld: 273 mg/dL — ABNORMAL HIGH (ref 70–99)
Potassium: 3.4 mmol/L — ABNORMAL LOW (ref 3.5–5.1)
Sodium: 135 mmol/L (ref 135–145)

## 2023-03-23 MED ORDER — LACTATED RINGERS IV SOLN: INTRAVENOUS | Status: DC

## 2023-03-23 MED ORDER — SODIUM CHLORIDE 0.9 % IV SOLN
1.0000 g | Freq: Once | INTRAVENOUS | Status: AC
  Administered 2023-03-24: 1 g via INTRAVENOUS
  Filled 2023-03-23: qty 10

## 2023-03-23 MED ORDER — CEPHALEXIN 500 MG PO CAPS: 500.0000 mg | ORAL_CAPSULE | Freq: Four times a day (QID) | ORAL | 0 refills | Status: AC

## 2023-03-23 MED ORDER — CEPHALEXIN 250 MG PO CAPS
500.0000 mg | ORAL_CAPSULE | Freq: Once | ORAL | Status: AC
  Administered 2023-03-23: 500 mg via ORAL
  Filled 2023-03-23: qty 2

## 2023-03-23 MED ORDER — ACETAMINOPHEN 500 MG PO TABS
1000.0000 mg | ORAL_TABLET | Freq: Once | ORAL | Status: AC
  Administered 2023-03-23: 1000 mg via ORAL
  Filled 2023-03-23: qty 2

## 2023-03-23 NOTE — ED Triage Notes (Signed)
 Pt arrives with family members due to burning with urination which began this am.  Pt has dementia but describes lower abdominal and lower back pain with this and has been incontinent of urine.

## 2023-03-23 NOTE — ED Provider Notes (Signed)
  Provider Note MRN:  [id number]  Arrival date & time: 03/24/23    ED Course and Medical Decision Making  Assumed care of patient at sign-out or upon transfer.  Bladder outlet obstruction with concern for possible UTI.  Plan is for dose of IV ceftriaxone Foley catheter placement.  No systemic symptoms and family is comfortable bringing him home with a catheter in place.  Procedures  Final Clinical Impressions(s) / ED Diagnoses     ICD-10-CM   1. Urinary retention  R33.9     2. Acute cystitis with hematuria  N30.01       ED Discharge Orders          Ordered    cephALEXin (KEFLEX) 500 MG capsule  4 times daily        03/23/23 2358              Discharge Instructions      1.  Take antibiotics 4 times daily as prescribed. 2.  Follow-up with your urologist as soon as possible. 2.  Return to the emergency department immediately if you have fever, worsening confusion, generally ill or other concerning symptoms.      Elmer Sow. Pilar Plate, MD [facility] [email or web address]    Sabas Sous, MD 03/24/23 [phone]

## 2023-03-23 NOTE — ED Provider Notes (Signed)
 [facility] EMERGENCY DEPARTMENT AT [facility] Provider Note   CSN: [id number] Arrival date & time: 03/23/23  1918     History  Chief Complaint  Patient presents with   Urinary Frequency    Justin Foster is a 78 y.o. male.  HPI Being seen with his family member present.  Patient has had burning with urination today.  He was also having difficulty urinating until his family member got some over-the-counter Azo for him.  Patient reports before he was having some left flank pain but he does not have any now.  Currently patient is denying any pain.  He has not had a fever.  He has not had any vomiting.  At baseline he has dementia but is up and active.  No change in activity level.  Family reports that the patient has had prostatic enlargement and had a Foley catheter before for urinary retention.    Home Medications Prior to Admission medications   Medication Sig Start Date End Date Taking? Authorizing Provider  cephALEXin (KEFLEX) 500 MG capsule Take 1 capsule (500 mg total) by mouth 4 (four) times daily. 03/23/23  Yes Arby Barrette, MD  acetaminophen (TYLENOL) 500 MG tablet Take 1,000 mg by mouth every 6 (six) hours as needed for mild pain.    [provider]  amLODipine (NORVASC) 10 MG tablet Take 10 mg by mouth at bedtime. 02/09/16   [provider]  aspirin EC 81 MG tablet Take 81 mg by mouth daily. 07/08/15   [provider]  empagliflozin (JARDIANCE) 25 MG TABS tablet Take 25 mg by mouth daily.    [provider]  ibuprofen (ADVIL,MOTRIN) 200 MG tablet Take 400 mg by mouth every 6 (six) hours as needed for headache or mild pain.    [provider]  metFORMIN (GLUCOPHAGE) 1000 MG tablet Take 500 mg by mouth. 02/09/16   [provider]  QUEtiapine (SEROQUEL) 25 MG tablet Take 1 tablet (25 mg total) by mouth at bedtime. 09/24/22 10/24/22  Jerald Kief, MD  ranitidine (ZANTAC) 150 MG tablet Take 150 mg by mouth as needed for  heartburn.    [provider]  simvastatin (ZOCOR) 20 MG tablet Take 5 mg by mouth at bedtime. 05/10/16   [provider]      Allergies    Patient has no known allergies.    Review of Systems   Review of Systems  Physical Exam Updated Vital Signs BP (!) 142/81 (BP Location: Left Arm)   Pulse 95   Temp 97.9 F (36.6 C) (Oral)   Resp 20   SpO2 100%  Physical Exam Constitutional:      Comments: Patient is alert.  He is up and wandering around the room.  No respiratory distress.  HENT:     Head: Normocephalic and atraumatic.     Mouth/Throat:     Pharynx: Oropharynx is clear.  Eyes:     Extraocular Movements: Extraocular movements intact.  Cardiovascular:     Rate and Rhythm: Normal rate and regular rhythm.  Pulmonary:     Effort: Pulmonary effort is normal.     Breath sounds: Normal breath sounds.  Abdominal:     Comments: Patient seems to have some suprapubic fullness.  He endorses some discomfort.  No severe pain.  Upper abdomen soft without guarding.  No CVA tenderness.  Musculoskeletal:        General: No swelling or tenderness. Normal range of motion.     Right lower  leg: No edema.     Left lower leg: No edema.  Skin:    General: Skin is warm and dry.  Neurological:     Comments: Patient is up and alert.  He is situationally cooperative.  No focal motor deficits.  Speech is intelligible.  At baseline patient has some dementia.     ED Results / Procedures / Treatments   Labs (all labs ordered are listed, but only abnormal results are displayed) Labs Reviewed  URINALYSIS, ROUTINE W REFLEX MICROSCOPIC - Abnormal; Notable for the following components:      Result Value   APPearance HAZY (*)    Glucose, UA >1,000 (*)    Hgb urine dipstick MODERATE (*)    Ketones, ur 15 (*)    Protein, ur 30 (*)    Leukocytes,Ua LARGE (*)    All other components within normal limits  BASIC METABOLIC PANEL - Abnormal; Notable for the following components:    Potassium 3.4 (*)    Chloride 95 (*)    Glucose, Bld 273 (*)    All other components within normal limits  CBC - Abnormal; Notable for the following components:   WBC 15.0 (*)    All other components within normal limits  URINE CULTURE    EKG None  Radiology CT Renal Stone Study Result Date: 03/23/2023 CLINICAL DATA:  Burning with urination. EXAM: CT ABDOMEN AND PELVIS WITHOUT CONTRAST TECHNIQUE: Multidetector CT imaging of the abdomen and pelvis was performed following the standard protocol without IV contrast. RADIATION DOSE REDUCTION: This exam was performed according to the departmental dose-optimization program which includes automated exposure control, adjustment of the mA and/or kV according to patient size and/or use of iterative reconstruction technique. COMPARISON:  September 18, 2022 FINDINGS: Lower chest: No acute abnormality. Hepatobiliary: No focal liver abnormality is seen. Numerous subcentimeter gallstones are seen within the gallbladder lumen without evidence of gallbladder wall thickening or biliary dilatation. Pancreas: Unremarkable. No pancreatic ductal dilatation or surrounding inflammatory changes. Spleen: Normal in size without focal abnormality. Adrenals/Urinary Tract: Adrenal glands are unremarkable. Kidneys are normal in size, without renal calculi. There is mild bilateral hydronephrosis and bilateral proximal hydroureter. A 3.8 cm exophytic cyst is seen within the mid left kidney. The urinary bladder is markedly distended. Stomach/Bowel: There is a small hiatal hernia. Appendix appears normal. No evidence of bowel wall thickening, distention, or inflammatory changes. Vascular/Lymphatic: Aortic atherosclerosis. No enlarged abdominal or pelvic lymph nodes. Reproductive: The prostate gland is markedly enlarged. Other: No abdominal wall hernia or abnormality. No abdominopelvic ascites. Musculoskeletal: No acute or significant osseous findings. IMPRESSION: 1. Markedly distended  urinary bladder with mild bilateral hydronephrosis and bilateral proximal hydroureter, likely consistent with sequelae related to bladder outlet obstruction. 2. Markedly enlarged prostate gland. 3. Cholelithiasis. 4. Small hiatal hernia. 5. Aortic atherosclerosis. Aortic Atherosclerosis (ICD10-I70.0). Electronically Signed   By: Aram Candela M.D.   On: 03/23/2023 23:23    Procedures Procedures    Medications Ordered in ED Medications  cefTRIAXone (ROCEPHIN) 1 g in sodium chloride 0.9 % 100 mL IVPB (has no administration in time range)  lactated ringers infusion (has no administration in time range)  cephALEXin (KEFLEX) capsule 500 mg (500 mg Oral Given 03/23/23 2229)  acetaminophen (TYLENOL) tablet 1,000 mg (1,000 mg Oral Given 03/23/23 2228)    ED Course/ Medical Decision Making/ A&P  Medical Decision Making Amount and/or Complexity of Data Reviewed Labs: ordered. Radiology: ordered.  Risk OTC drugs. Prescription drug management.   Patient presents as outlined.  He is alert.  He is up and ambulatory.  He is having urinary symptoms and on exam suggest no distended bladder.  Will proceed with CT scan for previous flank pain rule out stone and proceed with treatment for UTI.  Urinalysis grossly positive with greater than 50 WBCs.  Count 15,000.  CT shows bladder obstruction with some ureteral distention but no stones present.  Patient's family members feel comfortable with taking him home with a Foley catheter.  They report they have managed a Foley catheter at home before.  Patient is alert.  He does not have fever.  Vital signs are stable.  At this time he is not showing signs of sepsis.  Will plan to place Foley catheter and give 1 g of Rocephin.  Family members report they will build to follow-up with urology.  Return precautions reviewed.        Final Clinical Impression(s) / ED Diagnoses Final diagnoses:  Urinary retention  Acute  cystitis with hematuria    Rx / DC Orders ED Discharge Orders          Ordered    cephALEXin (KEFLEX) 500 MG capsule  4 times daily        03/23/23 2358              Arby Barrette, MD 03/23/23 2358

## 2023-03-23 NOTE — Discharge Instructions (Addendum)
 1.  Take antibiotics 4 times daily as prescribed. 2.  Follow-up with your urologist as soon as possible. 2.  Return to the emergency department immediately if you have fever, worsening confusion, generally ill or other concerning symptoms.

## 2023-03-26 LAB — URINE CULTURE: Culture: >=100000 — AB

## 2023-03-27 ENCOUNTER — Telehealth (HOSPITAL_BASED_OUTPATIENT_CLINIC_OR_DEPARTMENT_OTHER): Payer: Self-pay

## 2023-03-27 NOTE — Telephone Encounter (Signed)
 Post ED Visit - Positive Culture Follow-up: Successful Patient Follow-Up  Culture assessed and recommendations reviewed by:  [x]  [name], Pharm.D. []  Celedonio Miyamoto, Pharm.D., BCPS AQ-ID []  Garvin Fila, Pharm.D., BCPS []  Georgina Pillion, [address].D., BCPS []  [place], [place].D., BCPS, AAHIVP []  Estella Husk, Pharm.D., BCPS, AAHIVP []  Lysle Pearl, PharmD, BCPS []  Phillips Climes, PharmD, BCPS []  Agapito Games, PharmD, BCPS []  Verlan Friends, PharmD  Positive urine culture  []  Patient discharged without antimicrobial prescription and treatment is now indicated [x]  Organism is resistant to prescribed ED discharge antimicrobial []  Patient with positive blood cultures  Changes discussed with ED provider: Melene Plan, MD New antibiotic prescription Amoxil 500 mg po TID x 7 days  Called to [facility] in [place]  Contacted patients daughter, date 03/27/23, time 1:15 pm   Justin Foster 03/27/2023, 1:16 PM

## 2023-03-27 NOTE — Progress Notes (Signed)
 ED Antimicrobial Stewardship Positive Culture Follow Up   Justin Foster is an 78 y.o. male who presented to [facility] on 03/23/2023 with a chief complaint of  Chief Complaint  Patient presents with   Urinary Frequency    Recent Results (from the past 720 hours)  Urine Culture     Status: Abnormal   Collection Time: 03/23/23  7:28 PM   Specimen: Urine, Clean Catch  Result Value Ref Range Status   Specimen Description   Final    URINE, CLEAN CATCH Performed at [facility], [address], [place], [place] [address]    Special Requests   Final    NONE Performed at [facility], [address], [place], [place] [address]    Culture >=100,000 COLONIES/mL ENTEROCOCCUS FAECALIS (A)  Final   Report Status 03/26/2023 FINAL  Final   Organism ID, Bacteria ENTEROCOCCUS FAECALIS (A)  Final      Susceptibility   Enterococcus faecalis - MIC*    AMPICILLIN <=2 SENSITIVE Sensitive     NITROFURANTOIN <=16 SENSITIVE Sensitive     VANCOMYCIN 1 SENSITIVE Sensitive     * >=100,000 COLONIES/mL ENTEROCOCCUS FAECALIS    [x]  Treated with Keflex, organism resistant to prescribed antimicrobial []  Patient discharged originally without antimicrobial agent and treatment is now indicated  New antibiotic prescription: Amoxil  ED Provider: Melene Plan, MD   [name] 03/27/2023, 7:34 AM Clinical Pharmacist Monday - Friday phone -  [phone] Saturday - Sunday phone - [phone]
